# Patient Record
Sex: Female | Born: 1965 | Hispanic: No | Marital: Single | Smoking: Never smoker
Health system: Southern US, Community
[De-identification: ages and names within clinical notes are randomized; demographics above are authoritative.]

---

## 2002-01-10 ENCOUNTER — Encounter: Admission: RE | Admit: 2002-01-10 | Discharge: 2002-01-10 | Payer: Self-pay | Admitting: Sports Medicine

## 2002-01-12 ENCOUNTER — Encounter (INDEPENDENT_AMBULATORY_CARE_PROVIDER_SITE_OTHER): Payer: Self-pay | Admitting: *Deleted

## 2003-02-06 ENCOUNTER — Encounter (HOSPITAL_COMMUNITY): Admission: RE | Admit: 2003-02-06 | Discharge: 2003-02-06 | Payer: Self-pay | Admitting: *Deleted

## 2003-02-10 ENCOUNTER — Inpatient Hospital Stay (HOSPITAL_COMMUNITY): Admission: AD | Admit: 2003-02-10 | Discharge: 2003-02-13 | Payer: Self-pay | Admitting: Obstetrics and Gynecology

## 2003-09-27 ENCOUNTER — Emergency Department (HOSPITAL_COMMUNITY): Admission: EM | Admit: 2003-09-27 | Discharge: 2003-09-28 | Payer: Self-pay | Admitting: Emergency Medicine

## 2003-09-29 ENCOUNTER — Emergency Department (HOSPITAL_COMMUNITY): Admission: AD | Admit: 2003-09-29 | Discharge: 2003-09-29 | Payer: Self-pay | Admitting: Family Medicine

## 2006-07-20 ENCOUNTER — Inpatient Hospital Stay (HOSPITAL_COMMUNITY): Admission: AD | Admit: 2006-07-20 | Discharge: 2006-07-20 | Payer: Self-pay | Admitting: Obstetrics and Gynecology

## 2007-02-09 ENCOUNTER — Encounter (INDEPENDENT_AMBULATORY_CARE_PROVIDER_SITE_OTHER): Payer: Self-pay | Admitting: *Deleted

## 2007-03-01 ENCOUNTER — Inpatient Hospital Stay (HOSPITAL_COMMUNITY): Admission: AD | Admit: 2007-03-01 | Discharge: 2007-03-01 | Payer: Self-pay | Admitting: Obstetrics & Gynecology

## 2007-03-08 ENCOUNTER — Inpatient Hospital Stay (HOSPITAL_COMMUNITY): Admission: RE | Admit: 2007-03-08 | Discharge: 2007-03-09 | Payer: Self-pay | Admitting: Obstetrics and Gynecology

## 2017-07-06 ENCOUNTER — Ambulatory Visit (INDEPENDENT_AMBULATORY_CARE_PROVIDER_SITE_OTHER): Payer: BLUE CROSS/BLUE SHIELD | Admitting: Physician Assistant

## 2017-07-06 ENCOUNTER — Encounter: Payer: Self-pay | Admitting: Physician Assistant

## 2017-07-06 VITALS — BP 123/70 | HR 61 | Temp 98.1°F | Resp 18

## 2017-07-06 DIAGNOSIS — R8299 Other abnormal findings in urine: Secondary | ICD-10-CM | POA: Diagnosis not present

## 2017-07-06 DIAGNOSIS — R82998 Other abnormal findings in urine: Secondary | ICD-10-CM

## 2017-07-06 DIAGNOSIS — G44209 Tension-type headache, unspecified, not intractable: Secondary | ICD-10-CM

## 2017-07-06 DIAGNOSIS — M542 Cervicalgia: Secondary | ICD-10-CM

## 2017-07-06 LAB — POCT URINALYSIS DIP (MANUAL ENTRY)
BILIRUBIN UA: NEGATIVE mg/dL
Bilirubin, UA: NEGATIVE
Glucose, UA: NEGATIVE mg/dL
Nitrite, UA: NEGATIVE
PH UA: 5.5 (ref 5.0–8.0)
PROTEIN UA: NEGATIVE mg/dL
RBC UA: NEGATIVE
Spec Grav, UA: >=1.03 — AB (ref 1.010–1.025)
Urobilinogen, UA: 0.2 E.U./dL

## 2017-07-06 LAB — POCT CBC
GRANULOCYTE PERCENT: 69.6 % (ref 37–80)
HCT, POC: 36.3 % — AB (ref 37.7–47.9)
Hemoglobin: 11.6 g/dL — AB (ref 12.2–16.2)
LYMPH, POC: 2.1 (ref 0.6–3.4)
MCH, POC: 24.5 pg — AB (ref 27–31.2)
MCHC: 32 g/dL (ref 31.8–35.4)
MCV: 76.5 fL — AB (ref 80–97)
MID (CBC): 0.4 (ref 0–0.9)
MPV: 8.6 fL (ref 0–99.8)
PLATELET COUNT, POC: 259 10*3/uL (ref 142–424)
POC Granulocyte: 5.7 (ref 2–6.9)
POC LYMPH %: 25.5 % (ref 10–50)
POC MID %: 4.9 %M (ref 0–12)
RBC: 4.74 M/uL (ref 4.04–5.48)
RDW, POC: 14.8 %
WBC: 8.2 10*3/uL (ref 4.6–10.2)

## 2017-07-06 LAB — POCT URINE PREGNANCY: PREG TEST UR: NEGATIVE

## 2017-07-06 MED ORDER — CYCLOBENZAPRINE HCL 10 MG PO TABS: 5.0000 mg | ORAL_TABLET | Freq: Three times a day (TID) | ORAL | 0 refills | Status: DC | PRN

## 2017-07-06 MED ORDER — KETOROLAC TROMETHAMINE 60 MG/2ML IM SOLN
60.0000 mg | Freq: Once | INTRAMUSCULAR | Status: AC
  Administered 2017-07-06: 60 mg via INTRAMUSCULAR

## 2017-07-06 MED ORDER — IBUPROFEN 600 MG PO TABS: 600.0000 mg | ORAL_TABLET | Freq: Three times a day (TID) | ORAL | 0 refills | Status: DC | PRN

## 2017-07-06 NOTE — Progress Notes (Signed)
Patient expressed pain after shot while applying bandage/ S.Xitlali Kastens,CMA

## 2017-07-06 NOTE — Progress Notes (Signed)
07/07/2017 3:42 PM   DOB: 04/07/1966 / MRN: [id number]  SUBJECTIVE:  Linda Blackwell is a 51 y.o. female presenting for a headache that has been present for two weeks but has suddenly become worse. Has tried ibuprofen without relief.  No recent travel. No falls. Tells me the HA is constant and generalized in nature.  No acute changes in vision.  No history of HTN or diabetes. Feels that she is getting worse. No nausea.  Normal appetite.   She has No Known Allergies.   She  has no past medical history on file.    She  reports that she has never smoked. She has never used smokeless tobacco. She  has no sexual activity history on file. The patient  has no past surgical history on file.  Her family history is not on file.  Review of Systems  Constitutional: Negative for chills, diaphoresis and fever.  Eyes: Negative.   Respiratory: Negative for cough, hemoptysis, sputum production, shortness of breath and wheezing.   Cardiovascular: Negative for chest pain, orthopnea and leg swelling.  Gastrointestinal: Negative for abdominal pain and nausea.  Genitourinary: Negative for dysuria, flank pain, frequency, hematuria and urgency.  Skin: Negative for rash.  Neurological: Negative for dizziness, sensory change, speech change, focal weakness and headaches.    The problem list and medications were reviewed and updated by myself where necessary and exist elsewhere in the encounter.   OBJECTIVE:  BP 123/70 (BP Location: Right Arm, Patient Position: Supine, Cuff Size: Large)   Pulse 61   Temp 98.1 F (36.7 C) (Oral)   Resp 18   SpO2 98%   Physical Exam  Constitutional: She is oriented to person, place, and time. She is active.  Non-toxic appearance.  Eyes: Pupils are equal, round, and reactive to light. EOM are normal.  Cardiovascular: Normal rate, regular rhythm, S1 normal, S2 normal, normal heart sounds and intact distal pulses.  Exam reveals no gallop, no friction rub and no decreased pulses.    No murmur heard. Pulmonary/Chest: Effort normal. No stridor. No tachypnea. No respiratory distress. She has no wheezes. She has no rales.  Abdominal: She exhibits no distension.  Musculoskeletal: She exhibits no edema.  Neurological: She is alert and oriented to person, place, and time. She has normal strength and normal reflexes. She is not disoriented. She displays no atrophy, no tremor and normal reflexes. No cranial nerve deficit or sensory deficit. She exhibits normal muscle tone. She displays a negative Romberg sign. She displays no seizure activity. Coordination and gait normal. GCS eye subscore is 4. GCS verbal subscore is 5. GCS motor subscore is 6.  Skin: Skin is warm and dry. She is not diaphoretic. No pallor.  Psychiatric: Her behavior is normal.    Results for orders placed or performed in visit on 07/06/17 (from the past 72 hour(s))  POCT CBC     Status: Abnormal   Collection Time: 07/06/17  6:09 PM  Result Value Ref Range   WBC 8.2 4.6 - 10.2 K/uL   Lymph, poc 2.1 0.6 - 3.4   POC LYMPH PERCENT 25.5 10 - 50 %L   MID (cbc) 0.4 0 - 0.9   POC MID % 4.9 0 - 12 %M   POC Granulocyte 5.7 2 - 6.9   Granulocyte percent 69.6 37 - 80 %G   RBC 4.74 4.04 - 5.48 M/uL   Hemoglobin 11.6 (A) 12.2 - 16.2 g/dL   HCT, POC 86.536.3 (A) 78.437.7 - 47.9 %  MCV 76.5 (A) 80 - 97 fL   MCH, POC 24.5 (A) 27 - 31.2 pg   MCHC 32.0 31.8 - 35.4 g/dL   RDW, POC 16.114.8 %   Platelet Count, POC 259 142 - 424 K/uL   MPV 8.6 0 - 99.8 fL  POCT urinalysis dipstick     Status: Abnormal   Collection Time: 07/06/17  6:13 PM  Result Value Ref Range   Color, UA yellow yellow   Clarity, UA clear clear   Glucose, UA negative negative mg/dL   Bilirubin, UA negative negative   Ketones, POC UA negative negative mg/dL   Spec Grav, UA >=0.960>=1.030 (A) 1.010 - 1.025   Blood, UA negative negative   pH, UA 5.5 5.0 - 8.0   Protein Ur, POC negative negative mg/dL   Urobilinogen, UA 0.2 0.2 or 1.0 E.U./dL   Nitrite, UA Negative  Negative   Leukocytes, UA Trace (A) Negative  POCT urine pregnancy     Status: None   Collection Time: 07/06/17  6:13 PM  Result Value Ref Range   Preg Test, Ur Negative Negative   Orthostatic VS for the past 24 hrs:  BP- Sitting Pulse- Sitting BP- Standing at 0 minutes Pulse- Standing at 0 minutes  07/06/17 1730 117/81 70 124/82 65    No results found.  ASSESSMENT AND PLAN:  Linda Blackwell was seen today for headache.  Diagnoses and all orders for this visit:  Acute non intractable tension-type headache: Patient with complete resolution of symptoms with 60 of Toradol.  No concerning neurological findings.  -     ketorolac (TORADOL) injection 60 mg; Inject 2 mLs (60 mg total) into the muscle once. -     POCT urinalysis dipstick -     POCT CBC -     POCT urine pregnancy -     ibuprofen (ADVIL,MOTRIN) 600 MG tablet; Take 1 tablet (600 mg total) by mouth every 8 (eight) hours as needed.  Neck pain: Likely driving etiology.   Leukocytes in urine -     Urine Culture -     Urinalysis, microscopic only -     cyclobenzaprine (FLEXERIL) 10 MG tablet; Take 0.5-1 tablets (5-10 mg total) by mouth 3 (three) times daily as needed.    The patient is advised to call or return to clinic if she does not see an improvement in symptoms, or to seek the care of the closest emergency department if she worsens with the above plan.   Deliah BostonMichael Tameia Rafferty, MHS, PA-C [facility] 07/07/2017 3:42 PM

## 2017-07-06 NOTE — Patient Instructions (Signed)
     IF you received an x-ray today, you will receive an invoice from [facility]. Please contact  Radiology at [phone] with questions or concerns regarding your invoice.   IF you received labwork today, you will receive an invoice from [facility]. Please contact [facility] at [phone] with questions or concerns regarding your invoice.   Our billing staff will not be able to assist you with questions regarding bills from these companies.  You will be contacted with the lab results as soon as they are available. The fastest way to get your results is to activate your My Chart account. Instructions are located on the last page of this paperwork. If you have not heard from us regarding the results in 2 weeks, please contact this office.     

## 2017-07-08 LAB — URINE CULTURE

## 2017-07-08 LAB — URINALYSIS, MICROSCOPIC ONLY: CASTS: NONE SEEN /LPF

## 2017-07-16 ENCOUNTER — Emergency Department (HOSPITAL_COMMUNITY)
Admission: EM | Admit: 2017-07-16 | Discharge: 2017-07-16 | Disposition: A | Discharge status: Home / Self Care | Payer: BLUE CROSS/BLUE SHIELD | Attending: Emergency Medicine | Admitting: Emergency Medicine

## 2017-07-16 ENCOUNTER — Emergency Department (HOSPITAL_COMMUNITY): Payer: BLUE CROSS/BLUE SHIELD

## 2017-07-16 DIAGNOSIS — Y92009 Unspecified place in unspecified non-institutional (private) residence as the place of occurrence of the external cause: Secondary | ICD-10-CM | POA: Insufficient documentation

## 2017-07-16 DIAGNOSIS — Y999 Unspecified external cause status: Secondary | ICD-10-CM | POA: Diagnosis not present

## 2017-07-16 DIAGNOSIS — S0003XA Contusion of scalp, initial encounter: Secondary | ICD-10-CM | POA: Diagnosis not present

## 2017-07-16 DIAGNOSIS — Y93E5 Activity, floor mopping and cleaning: Secondary | ICD-10-CM | POA: Insufficient documentation

## 2017-07-16 DIAGNOSIS — S0990XA Unspecified injury of head, initial encounter: Secondary | ICD-10-CM

## 2017-07-16 DIAGNOSIS — M542 Cervicalgia: Secondary | ICD-10-CM | POA: Diagnosis not present

## 2017-07-16 DIAGNOSIS — W07XXXA Fall from chair, initial encounter: Secondary | ICD-10-CM | POA: Insufficient documentation

## 2017-07-16 DIAGNOSIS — W19XXXA Unspecified fall, initial encounter: Secondary | ICD-10-CM

## 2017-07-16 LAB — CBG MONITORING, ED: GLUCOSE-CAPILLARY: 97 mg/dL (ref 65–99)

## 2017-07-16 MED ORDER — METHOCARBAMOL 500 MG PO TABS
1000.0000 mg | ORAL_TABLET | Freq: Once | ORAL | Status: AC
  Administered 2017-07-16: 1000 mg via ORAL
  Filled 2017-07-16: qty 2

## 2017-07-16 MED ORDER — IBUPROFEN 800 MG PO TABS: 800.0000 mg | ORAL_TABLET | Freq: Three times a day (TID) | ORAL | 0 refills | Status: DC

## 2017-07-16 MED ORDER — KETOROLAC TROMETHAMINE 60 MG/2ML IM SOLN: 60.0000 mg | Freq: Once | INTRAMUSCULAR | Status: DC

## 2017-07-16 MED ORDER — METHOCARBAMOL 500 MG PO TABS: 1000.0000 mg | ORAL_TABLET | Freq: Three times a day (TID) | ORAL | 0 refills | Status: DC | PRN

## 2017-07-16 NOTE — Discharge Instructions (Signed)
1. Take ibuprofen and Robaxin for pain. 2. You may apply ice pack to the back of your neck and head for the next 48 hours as needed for pain. 3. See her family doctor for recheck within the next 3-5 days. 4. Return to the emergency department if symptoms are worsening or changing.

## 2017-07-16 NOTE — ED Triage Notes (Signed)
Pt states she was cleaning her house because its her birthday and she is having a party tonight. Pt states she slipped and fell hitting her head on the counter. Her son was outside when she fell, but states when he heard the noise he came inside and saw his mother on the floor with her eyes closed. Pt arrived alert and oriented x4 c/o dizziness, neck pain, and head pain. C-collar placed.

## 2017-07-16 NOTE — ED Provider Notes (Signed)
[facility]-EMERGENCY DEPT Provider Note   CSN: [id number] Arrival date & time: 07/16/17  1757     History   Chief Complaint Chief Complaint  Patient presents with  . Head Injury    HPI Linda Blackwell is a 51 y.o. female.  HPI Patient reports that she was cleaning her house. She states today is her birthday. She was standing on a folding chair and fell backwards. She reports she hit the left side of the back of her head and her neck on the counter. She reports she has a lot of pain. Patient's son heard her fall and came in to check on her. He reports her eyes were closed and he thinks she may have passed out. Patient is alert and appropriate now. She does not have weakness numbness or tingling to extremities. She however reports pain on the right posterior neck. She reports is much worse with any type of movement. She denies being on any blood thinners. She denies any medical history. No past medical history on file.  There are no active problems to display for this patient.   No past surgical history on file.  OB History    No data available       Home Medications    Prior to Admission medications   Medication Sig Start Date End Date Taking? Authorizing Provider  cyclobenzaprine (FLEXERIL) 10 MG tablet Take 0.5-1 tablets (5-10 mg total) by mouth 3 (three) times daily as needed. 07/06/17  Yes Ofilia Neaslark, Michael L, PA-C  hydroxypropyl methylcellulose / hypromellose (ISOPTO TEARS / GONIOVISC) 2.5 % ophthalmic solution Place 1 drop into both eyes as needed for dry eyes.   Yes [provider]  ibuprofen (ADVIL,MOTRIN) 600 MG tablet Take 1 tablet (600 mg total) by mouth every 8 (eight) hours as needed. 07/06/17  Yes Ofilia Neaslark, Michael L, PA-C  ibuprofen (ADVIL,MOTRIN) 800 MG tablet Take 1 tablet (800 mg total) by mouth 3 (three) times daily. 07/16/17   Arby BarrettePfeiffer, Kena Limon, MD  methocarbamol (ROBAXIN) 500 MG tablet Take 2 tablets (1,000 mg total) by mouth every 8 (eight) hours as needed for muscle  spasms. 07/16/17   Arby BarrettePfeiffer, Annastacia Duba, MD    Family History No family history on file.  Social History Social History  Substance Use Topics  . Smoking status: Never Smoker  . Smokeless tobacco: Never Used  . Alcohol use Not on file     Allergies   Patient has no known allergies.   Review of Systems Review of Systems 10 Systems reviewed and are negative for acute change except as noted in the HPI.   Physical Exam Updated Vital Signs BP 140/83   Temp 98.4 F (36.9 C) (Oral)   Resp 16   Ht 5\' 2"  (1.575 m)   Wt 89.8 kg (198 lb)   SpO2 100%   BMI 36.21 kg/m   Physical Exam  Constitutional: She is oriented to person, place, and time.  GCS 15. Patient is alert and appropriate. No respiratory distress. She is slightly tearful and complaining of pain. Well-nourished well-developed.  HENT:  Right Ear: External ear normal.  Left Ear: External ear normal.  Nose: Nose normal.  Mouth/Throat: Oropharynx is clear and moist.  Patient was a significant tenderness to palpation of the posterior parietal left scalp. No facial injury.  Eyes: Pupils are equal, round, and reactive to light. EOM are normal.  Neck:  Patient endorses significant pain to palpation to the midline and left lateral C-spine.  Cardiovascular: Normal rate, regular rhythm, normal heart  sounds and intact distal pulses.   Pulmonary/Chest: Effort normal and breath sounds normal.  Abdominal: Soft. She exhibits no distension. There is no tenderness. There is no guarding.  Musculoskeletal: Normal range of motion. She exhibits no edema, tenderness or deformity.  Neurological: She is alert and oriented to person, place, and time. No cranial nerve deficit. Coordination normal.  Skin: Skin is warm and dry.  Psychiatric:  Patient is tearful and anxious.     ED Treatments / Results  Labs (all labs ordered are listed, but only abnormal results are displayed) Labs Reviewed  CBG MONITORING, ED    EKG  EKG  Interpretation None       Radiology Ct Head Wo Contrast  Result Date: 07/16/2017 CLINICAL DATA:  Patient slipped and fell hitting head on counter today. Dizziness, neck pain and headache. EXAM: CT HEAD WITHOUT CONTRAST CT CERVICAL SPINE WITHOUT CONTRAST TECHNIQUE: Multidetector CT imaging of the head and cervical spine was performed following the standard protocol without intravenous contrast. Multiplanar CT image reconstructions of the cervical spine were also generated. COMPARISON:  None. FINDINGS: CT HEAD FINDINGS BRAIN: The ventricles and sulci are normal. No intraparenchymal hemorrhage, mass effect nor midline shift. No acute large vascular territory infarcts. No abnormal extra-axial fluid collections. Basal cisterns are midline and not effaced. No acute cerebellar abnormality. VASCULAR: Unremarkable. SKULL/SOFT TISSUES: No skull fracture. No significant soft tissue swelling. ORBITS/SINUSES: The included ocular globes and orbital contents are normal.The mastoid air-cells and included paranasal sinuses are well-aerated. OTHER: None. CT CERVICAL SPINE FINDINGS ALIGNMENT: Vertebral bodies in alignment. Maintained lordosis. SKULL BASE AND VERTEBRAE: Cervical vertebral bodies and posterior elements are intact. Slight disc space narrowing noted at C4-5. Destructive bony lesions. C1-2 articulation maintained. No jumped or perched facets. Spinous process ease appear intact. SOFT TISSUES AND SPINAL CANAL: Normal. DISC LEVELS: No significant osseous canal stenosis or neural foraminal narrowing. UPPER CHEST: Lung apices are clear. OTHER: None. IMPRESSION: 1. No acute intracranial abnormality. 2. No acute posttraumatic cervical spine fracture or subluxation. Electronically Signed   By: Tollie Ethavid  Kwon M.D.   On: 07/16/2017 19:11   Ct Cervical Spine Wo Contrast  Result Date: 07/16/2017 CLINICAL DATA:  Patient slipped and fell hitting head on counter today. Dizziness, neck pain and headache. EXAM: CT HEAD WITHOUT  CONTRAST CT CERVICAL SPINE WITHOUT CONTRAST TECHNIQUE: Multidetector CT imaging of the head and cervical spine was performed following the standard protocol without intravenous contrast. Multiplanar CT image reconstructions of the cervical spine were also generated. COMPARISON:  None. FINDINGS: CT HEAD FINDINGS BRAIN: The ventricles and sulci are normal. No intraparenchymal hemorrhage, mass effect nor midline shift. No acute large vascular territory infarcts. No abnormal extra-axial fluid collections. Basal cisterns are midline and not effaced. No acute cerebellar abnormality. VASCULAR: Unremarkable. SKULL/SOFT TISSUES: No skull fracture. No significant soft tissue swelling. ORBITS/SINUSES: The included ocular globes and orbital contents are normal.The mastoid air-cells and included paranasal sinuses are well-aerated. OTHER: None. CT CERVICAL SPINE FINDINGS ALIGNMENT: Vertebral bodies in alignment. Maintained lordosis. SKULL BASE AND VERTEBRAE: Cervical vertebral bodies and posterior elements are intact. Slight disc space narrowing noted at C4-5. Destructive bony lesions. C1-2 articulation maintained. No jumped or perched facets. Spinous process ease appear intact. SOFT TISSUES AND SPINAL CANAL: Normal. DISC LEVELS: No significant osseous canal stenosis or neural foraminal narrowing. UPPER CHEST: Lung apices are clear. OTHER: None. IMPRESSION: 1. No acute intracranial abnormality. 2. No acute posttraumatic cervical spine fracture or subluxation. Electronically Signed   By: Rene Kocheravid  Kwon M.D.  On: 07/16/2017 19:11    Procedures Procedures (including critical care time)  Medications Ordered in ED Medications  methocarbamol (ROBAXIN) tablet 1,000 mg (not administered)     Initial Impression / Assessment and Plan / ED Course  I have reviewed the triage vital signs and the nursing notes.  Pertinent labs & imaging results that were available during my care of the patient were reviewed by me and considered in  my medical decision making (see chart for details).     Final Clinical Impressions(s) / ED Diagnoses   Final diagnoses:  Contusion of scalp, initial encounter  Minor head injury, initial encounter  Cervical spine pain  Fall, initial encounter   CT does not show intracranial or C-spine injury. Patient does not have neurologic deficit or confusion. At this time I feel she is appropriate for home observation with head injury precautions. Patient is getting relief from ibuprofen she took prior to arrival. I will have patient continue ibuprofen and Robaxin. New Prescriptions New Prescriptions   IBUPROFEN (ADVIL,MOTRIN) 800 MG TABLET    Take 1 tablet (800 mg total) by mouth 3 (three) times daily.   METHOCARBAMOL (ROBAXIN) 500 MG TABLET    Take 2 tablets (1,000 mg total) by mouth every 8 (eight) hours as needed for muscle spasms.     Arby Barrette, MD 07/16/17 [phone]

## 2017-08-07 ENCOUNTER — Ambulatory Visit: Payer: BLUE CROSS/BLUE SHIELD | Admitting: Physician Assistant

## 2017-12-16 IMAGING — CT CT CERVICAL SPINE W/O CM
4 of 8 series · 12 of 33 positions shown, 13 images · non-contrast
Comparison: None.

CLINICAL DATA: Patient slipped and fell hitting head on counter
today. Dizziness, neck pain and headache.

EXAM:
CT HEAD WITHOUT CONTRAST
CT CERVICAL SPINE WITHOUT CONTRAST
TECHNIQUE: Multidetector CT imaging of the head and cervical spine was
performed following the standard protocol without intravenous
contrast. Multiplanar CT image reconstructions of the cervical spine
were also generated.

[Series 6: sagittal · sagittal · 0.35mm/px · 5 of 64 slices shown]
[im 11/64  bone]
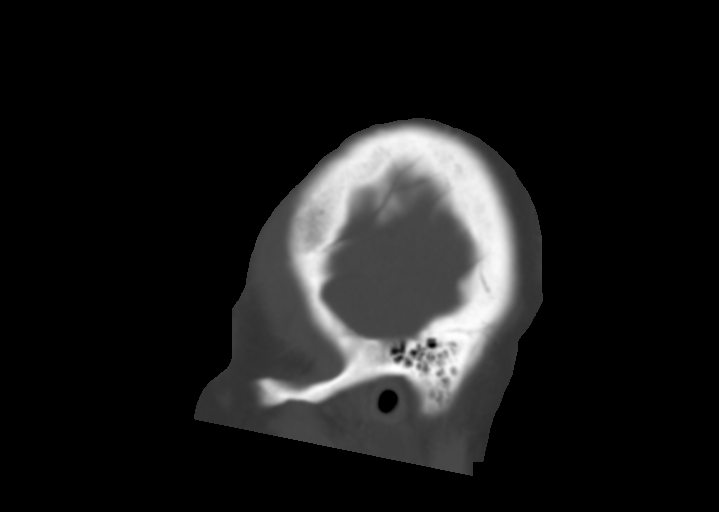
[im 22/64  bone]
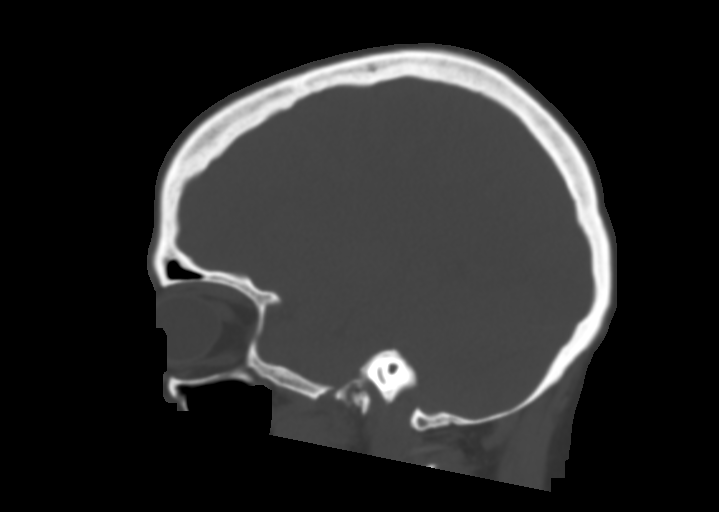
[im 32/64  bone]
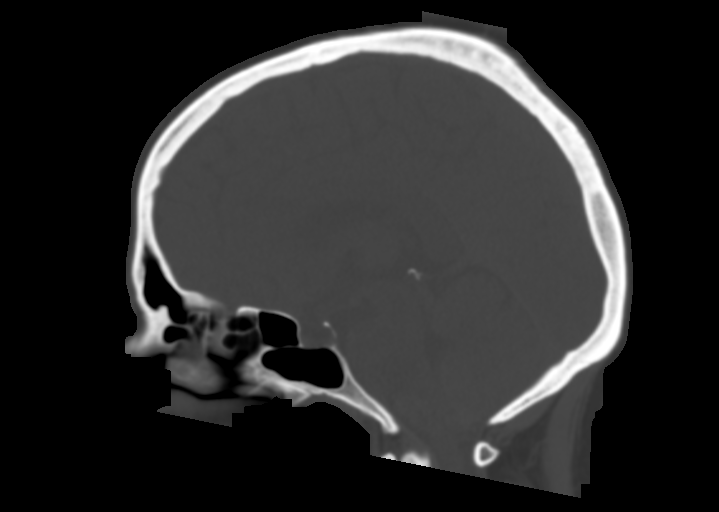
[im 43/64  bone]
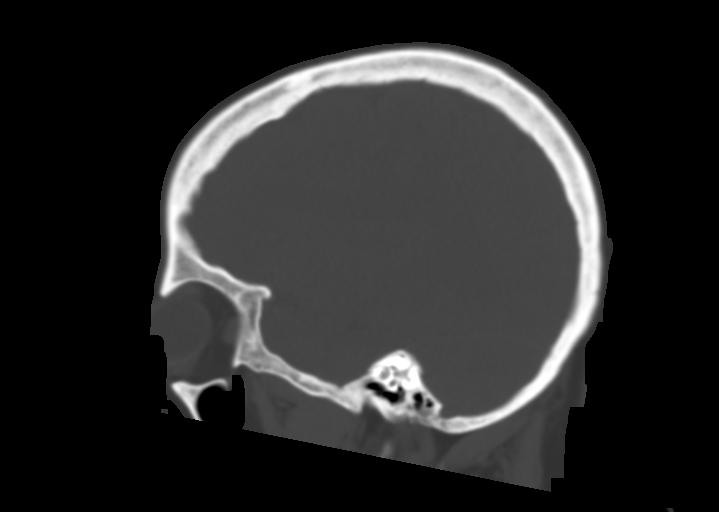
[im 53/64  bone]
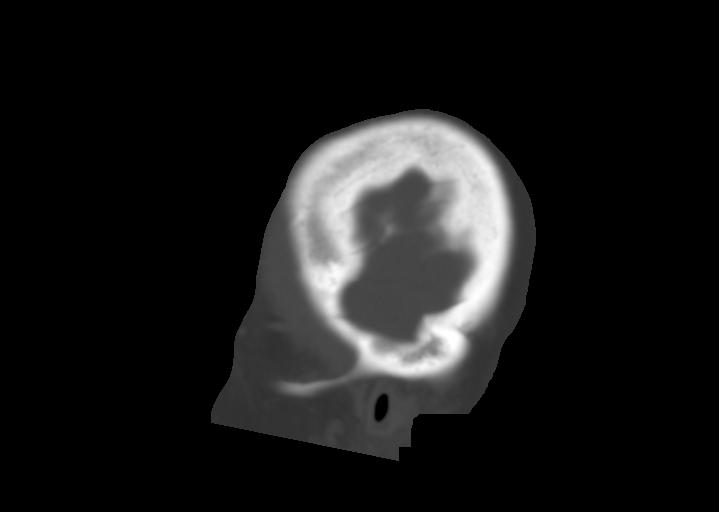

[Series 7: c-spine st · axial · 0.30mm/px · z∈[+1234,+1314]mm · 3 of 81 slices shown, 4 images]
[im 21/81  soft-tissue]
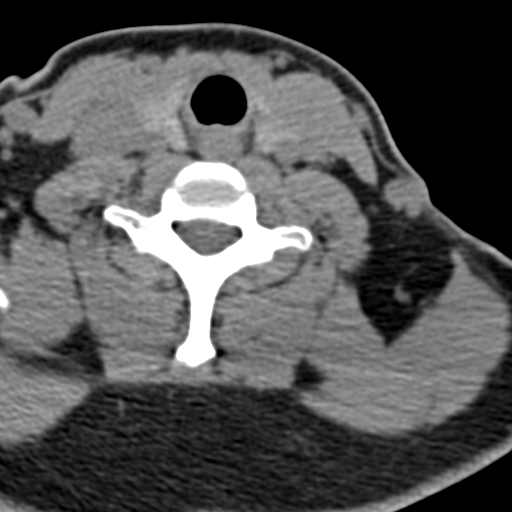
[im 21/81  bone]
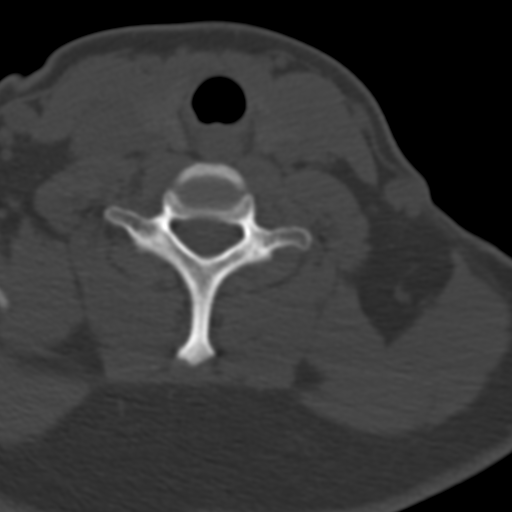
[im 41/81  bone]
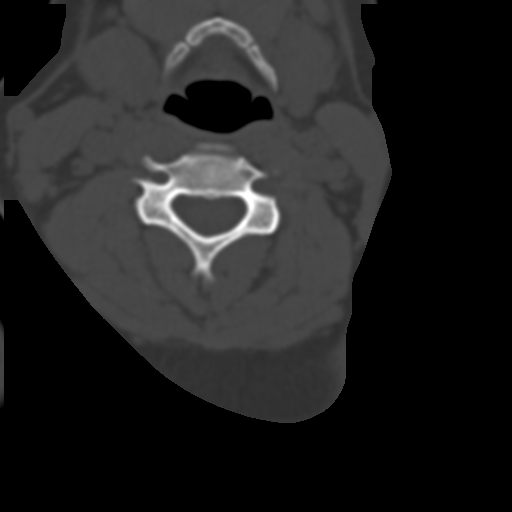
[im 61/81  bone]
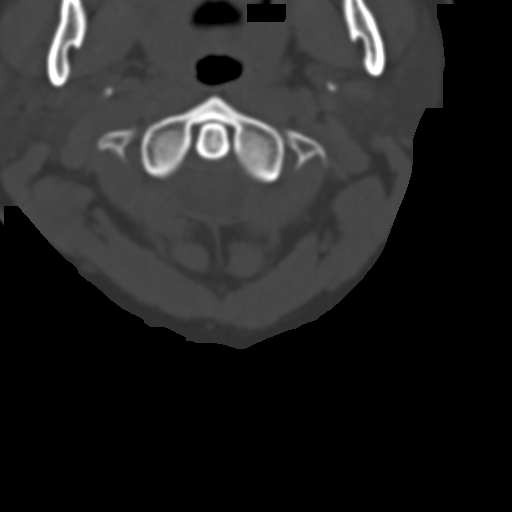

[Series 10: axial recon · axial · 0.23mm/px · z∈[+1220,+1298]mm · 3 of 81 slices shown]
[im 21/81  bone]
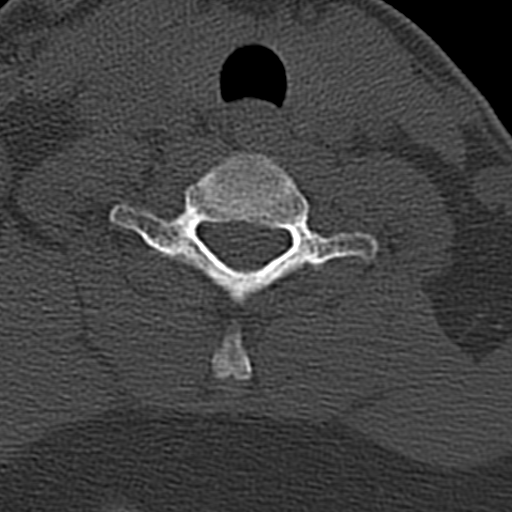
[im 41/81  bone]
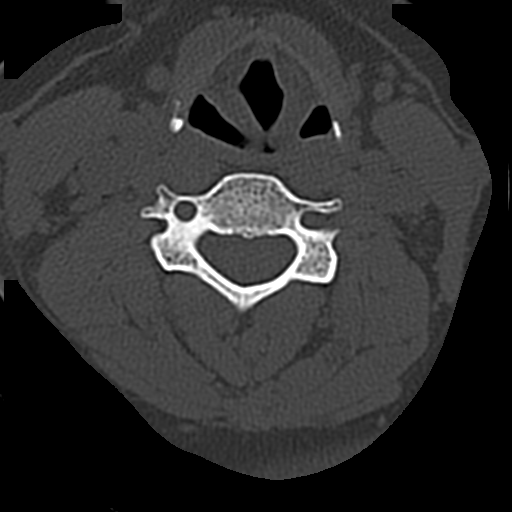
[im 61/81  bone]
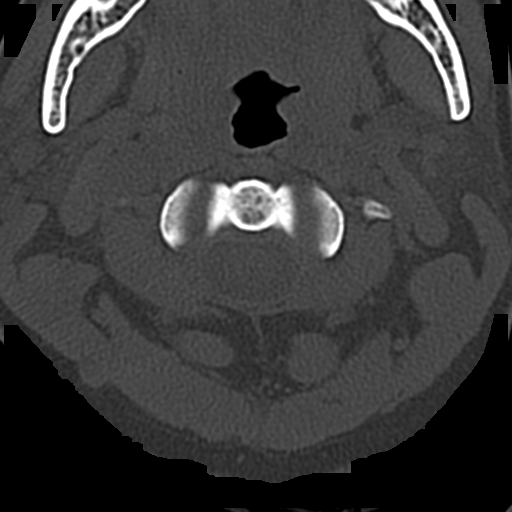

[Series 11: coronal · coronal · 0.23mm/px · 1 of 66 slices shown]
[im 33/66  bone]
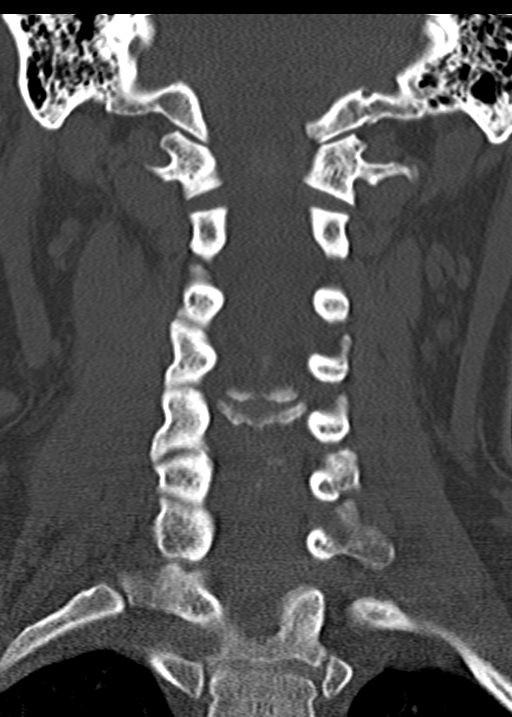

[12 of 33 positions shown; findings below may reference images not displayed]

FINDINGS: CT HEAD FINDINGS

BRAIN: The ventricles and sulci are normal. No intraparenchymal
hemorrhage, mass effect nor midline shift. No acute large vascular
territory infarcts. No abnormal extra-axial fluid collections. Basal
cisterns are midline and not effaced. No acute cerebellar
abnormality.

VASCULAR: Unremarkable.

SKULL/SOFT TISSUES: No skull fracture. No significant soft tissue
swelling.

ORBITS/SINUSES: The included ocular globes and orbital contents are
normal.The mastoid air-cells and included paranasal sinuses are
well-aerated.

OTHER: None.

CT CERVICAL SPINE FINDINGS

ALIGNMENT: Vertebral bodies in alignment. Maintained lordosis.

SKULL BASE AND VERTEBRAE: Cervical vertebral bodies and posterior
elements are intact. Slight disc space narrowing noted at C4-5.
Destructive bony lesions. C1-2 articulation maintained. No jumped or
perched facets. Spinous process ease appear intact.

SOFT TISSUES AND SPINAL CANAL: Normal.

DISC LEVELS: No significant osseous canal stenosis or neural
foraminal narrowing.

UPPER CHEST: Lung apices are clear.

OTHER: None.
IMPRESSION: 1. No acute intracranial abnormality.
2. No acute posttraumatic cervical spine fracture or subluxation.

## 2019-04-02 ENCOUNTER — Ambulatory Visit: Payer: BLUE CROSS/BLUE SHIELD | Admitting: Family Medicine

## 2021-01-07 ENCOUNTER — Other Ambulatory Visit: Payer: Self-pay

## 2021-01-07 DIAGNOSIS — Z20822 Contact with and (suspected) exposure to covid-19: Secondary | ICD-10-CM | POA: Insufficient documentation

## 2021-01-07 DIAGNOSIS — R8290 Unspecified abnormal findings in urine: Secondary | ICD-10-CM | POA: Insufficient documentation

## 2021-01-07 DIAGNOSIS — R5383 Other fatigue: Secondary | ICD-10-CM | POA: Diagnosis not present

## 2021-01-07 LAB — BASIC METABOLIC PANEL
Anion gap: 11 (ref 5–15)
BUN: 17 mg/dL (ref 6–20)
CO2: 26 mmol/L (ref 22–32)
Calcium: 9.1 mg/dL (ref 8.9–10.3)
Chloride: 102 mmol/L (ref 98–111)
Creatinine, Ser: 0.91 mg/dL (ref 0.44–1.00)
GFR, Estimated: >60 mL/min (ref >60)
Glucose, Bld: 103 mg/dL — ABNORMAL HIGH (ref 70–99)
Potassium: 3.8 mmol/L (ref 3.5–5.1)
Sodium: 139 mmol/L (ref 135–145)

## 2021-01-07 LAB — CBC
HCT: 38.2 % (ref 36.0–46.0)
Hemoglobin: 11.9 g/dL — ABNORMAL LOW (ref 12.0–15.0)
MCH: 25.4 pg — ABNORMAL LOW (ref 26.0–34.0)
MCHC: 31.2 g/dL (ref 30.0–36.0)
MCV: 81.6 fL (ref 80.0–100.0)
Platelets: 276 10*3/uL (ref 150–400)
RBC: 4.68 MIL/uL (ref 3.87–5.11)
RDW: 13.6 % (ref 11.5–15.5)
WBC: 8.2 10*3/uL (ref 4.0–10.5)
nRBC: 0 % (ref 0.0–0.2)

## 2021-01-07 LAB — I-STAT BETA HCG BLOOD, ED (MC, WL, AP ONLY): I-stat hCG, quantitative: 5.2 m[IU]/mL — ABNORMAL HIGH (ref <5)

## 2021-01-07 NOTE — ED Triage Notes (Signed)
Pt presents from home, c/o fatigue and dizziness since Monday that has gotten worse. Denies fevers, cough, n/v/d or sick exposure

## 2021-01-08 ENCOUNTER — Encounter (HOSPITAL_COMMUNITY): Payer: Self-pay | Admitting: Emergency Medicine

## 2021-01-08 ENCOUNTER — Emergency Department (HOSPITAL_COMMUNITY)
Admission: EM | Admit: 2021-01-08 | Discharge: 2021-01-08 | Disposition: A | Discharge status: Home / Self Care | Payer: BC Managed Care – PPO | Attending: Emergency Medicine | Admitting: Emergency Medicine

## 2021-01-08 DIAGNOSIS — R8290 Unspecified abnormal findings in urine: Secondary | ICD-10-CM

## 2021-01-08 DIAGNOSIS — R5383 Other fatigue: Secondary | ICD-10-CM

## 2021-01-08 LAB — URINALYSIS, ROUTINE W REFLEX MICROSCOPIC
Bacteria, UA: NONE SEEN
Bilirubin Urine: NEGATIVE
Glucose, UA: NEGATIVE mg/dL
Hgb urine dipstick: NEGATIVE
Ketones, ur: 5 mg/dL — AB
Nitrite: NEGATIVE
Protein, ur: 30 mg/dL — AB
Specific Gravity, Urine: 1.034 — ABNORMAL HIGH (ref 1.005–1.030)
pH: 5 (ref 5.0–8.0)

## 2021-01-08 LAB — TROPONIN I (HIGH SENSITIVITY): Troponin I (High Sensitivity): 4 ng/L (ref <18)

## 2021-01-08 LAB — TSH: TSH: 1.512 u[IU]/mL (ref 0.350–4.500)

## 2021-01-08 LAB — SARS CORONAVIRUS 2 (TAT 6-24 HRS): SARS Coronavirus 2: NEGATIVE

## 2021-01-08 MED ORDER — CEPHALEXIN 500 MG PO CAPS: 500.0000 mg | ORAL_CAPSULE | Freq: Two times a day (BID) | ORAL | 0 refills | Status: DC

## 2021-01-08 NOTE — Discharge Instructions (Addendum)
Your urine looks like it could be infected.  This could cause fatigue.  We will treat you with antibiotic.  We will call you if your Covid test is positive.  I also ordered a thyroid test, but this will not result tonight.  You can have your primary care doctor follow-up on this.

## 2021-01-08 NOTE — ED Provider Notes (Signed)
[facility]-EMERGENCY DEPT Provider Note   CSN: [id number] Arrival date & time: 01/07/21  1956     History Chief Complaint  Patient presents with  . Fatigue  . Dizziness    Linda Blackwell is a 55 y.o. female.  Patient presents to the emergency department with a chief complaint of generalized fatigue.  She states that she has felt very fatigued over the past several days.  She denies any other associated symptoms, specifically no fever, cough, chest pain, shortness of breath.  She is vaccinated against Covid, but she has had potential exposures at work.  She states that she is normally very healthy, and is not certain why she feels so fatigued.  She states that she also feels somewhat dizzy when she goes from sitting to standing.  Denies numbness, weakness, or tingling.  The history is provided by the patient. No language interpreter was used.       No past medical history on file.  There are no problems to display for this patient.   History reviewed. No pertinent surgical history.   OB History   No obstetric history on file.     No family history on file.  Social History   Tobacco Use  . Smoking status: Never Smoker  . Smokeless tobacco: Never Used    Home Medications Prior to Admission medications   Medication Sig Start Date End Date Taking? Authorizing Provider  cyclobenzaprine (FLEXERIL) 10 MG tablet Take 0.5-1 tablets (5-10 mg total) by mouth 3 (three) times daily as needed. 07/06/17   Ofilia Neas, PA-C  hydroxypropyl methylcellulose / hypromellose (ISOPTO TEARS / GONIOVISC) 2.5 % ophthalmic solution Place 1 drop into both eyes as needed for dry eyes.    [provider]  ibuprofen (ADVIL,MOTRIN) 600 MG tablet Take 1 tablet (600 mg total) by mouth every 8 (eight) hours as needed. 07/06/17   Ofilia Neas, PA-C  ibuprofen (ADVIL,MOTRIN) 800 MG tablet Take 1 tablet (800 mg total) by mouth 3 (three) times daily. 07/16/17   Arby Barrette, MD  methocarbamol (ROBAXIN) 500 MG tablet Take 2 tablets (1,000 mg total) by mouth every 8 (eight) hours as needed for muscle spasms. 07/16/17   Arby Barrette, MD    Allergies    Patient has no known allergies.  Review of Systems   Review of Systems  All other systems reviewed and are negative.   Physical Exam Updated Vital Signs BP 139/82 (BP Location: Right Arm)   Pulse 60   Temp 98.2 F (36.8 C) (Oral)   Resp 20   Ht 5\' 5"  (1.651 m)   Wt 96.2 kg   LMP  (LMP Unknown)   SpO2 99%   BMI 35.28 kg/m   Physical Exam Vitals and nursing note reviewed.  Constitutional:      General: She is not in acute distress.    Appearance: She is well-developed and well-nourished.  HENT:     Head: Normocephalic and atraumatic.  Eyes:     Conjunctiva/sclera: Conjunctivae normal.  Cardiovascular:     Rate and Rhythm: Normal rate and regular rhythm.     Heart sounds: No murmur heard.   Pulmonary:     Effort: Pulmonary effort is normal. No respiratory distress.     Breath sounds: Normal breath sounds.  Abdominal:     Palpations: Abdomen is soft.     Tenderness: There is no abdominal tenderness.  Musculoskeletal:        General: No edema. Normal range  of motion.     Cervical back: Neck supple.     Comments: Ambulates without difficulty  Skin:    General: Skin is warm and dry.  Neurological:     Mental Status: She is alert and oriented to person, place, and time.  Psychiatric:        Mood and Affect: Mood and affect and mood normal.        Behavior: Behavior normal.     ED Results / Procedures / Treatments   Labs (all labs ordered are listed, but only abnormal results are displayed) Labs Reviewed  BASIC METABOLIC PANEL - Abnormal; Notable for the following components:      Result Value   Glucose, Bld 103 (*)    All other components within normal limits  CBC - Abnormal; Notable for the following components:   Hemoglobin 11.9 (*)    MCH 25.4 (*)    All other  components within normal limits  I-STAT BETA HCG BLOOD, ED ([facility], [facility], [facility] ONLY) - Abnormal; Notable for the following components:   I-stat hCG, quantitative 5.2 (*)    All other components within normal limits  SARS CORONAVIRUS 2 (TAT 6-24 HRS)  URINALYSIS, ROUTINE W REFLEX MICROSCOPIC  TSH  TROPONIN I (HIGH SENSITIVITY)    EKG None  Radiology No results found.  Procedures Procedures   Medications Ordered in ED Medications - No data to display  ED Course  I have reviewed the triage vital signs and the nursing notes.  Pertinent labs & imaging results that were available during my care of the patient were reviewed by me and considered in my medical decision making (see chart for details).    MDM Rules/Calculators/A&P                          This patient complains of generalized fatigue x a few days, this involves an extensive number of treatment options, and is a complaint that carries with it a high risk of complications and morbidity.    Differential Dx COVID, thyroid, electrolyte abnormality, dehydration, hypoglycemia  Pertinent Labs I ordered, reviewed, and interpreted labs, which included CBC notable for no significant leukocytosis, BMP shows normal electrolytes, hCG is 5.2, urinalysis shows findings consistent with UTI, troponin is 4, doubt ACS.  Covid test pending..  Reassessments After the interventions stated above, I reevaluated the patient and found patient requesting discharge.  She would like to go home now.  Her labs are fairly reassuring.  Covid test still pending.  Fatigue could also be attributed to UTI, will cover with Keflex.  Consultants None  Plan Discharge with PCP follow-up.    Final Clinical Impression(s) / ED Diagnoses Final diagnoses:  Fatigue, unspecified type  Abnormal urinalysis    Rx / DC Orders ED Discharge Orders         Ordered    cephALEXin (KEFLEX) 500 MG capsule  2 times daily        01/08/21 0149           Roxy Horseman, PA-C 01/08/21 0152    Palumbo, April, MD 01/08/21 0201

## 2021-01-08 NOTE — ED Notes (Signed)
Discharged no concerns at this time.  °

## 2022-07-14 ENCOUNTER — Emergency Department (HOSPITAL_COMMUNITY): Payer: BC Managed Care – PPO

## 2022-07-14 ENCOUNTER — Other Ambulatory Visit: Payer: Self-pay

## 2022-07-14 ENCOUNTER — Observation Stay (HOSPITAL_COMMUNITY)
Admission: EM | Admit: 2022-07-14 | Discharge: 2022-07-15 | Disposition: A | Discharge status: Home / Self Care | Payer: BC Managed Care – PPO | Attending: Internal Medicine | Admitting: Internal Medicine

## 2022-07-14 DIAGNOSIS — R2990 Unspecified symptoms and signs involving the nervous system: Secondary | ICD-10-CM

## 2022-07-14 DIAGNOSIS — G459 Transient cerebral ischemic attack, unspecified: Principal | ICD-10-CM | POA: Diagnosis present

## 2022-07-14 DIAGNOSIS — H518 Other specified disorders of binocular movement: Secondary | ICD-10-CM

## 2022-07-14 DIAGNOSIS — Z79899 Other long term (current) drug therapy: Secondary | ICD-10-CM | POA: Insufficient documentation

## 2022-07-14 DIAGNOSIS — R531 Weakness: Secondary | ICD-10-CM | POA: Diagnosis not present

## 2022-07-14 DIAGNOSIS — R414 Neurologic neglect syndrome: Secondary | ICD-10-CM | POA: Diagnosis not present

## 2022-07-14 DIAGNOSIS — R200 Anesthesia of skin: Secondary | ICD-10-CM | POA: Diagnosis not present

## 2022-07-14 DIAGNOSIS — R29818 Other symptoms and signs involving the nervous system: Secondary | ICD-10-CM

## 2022-07-14 LAB — I-STAT CHEM 8, ED
BUN: 11 mg/dL (ref 6–20)
Calcium, Ion: 1.12 mmol/L — ABNORMAL LOW (ref 1.15–1.40)
Chloride: 104 mmol/L (ref 98–111)
Creatinine, Ser: 0.6 mg/dL (ref 0.44–1.00)
Glucose, Bld: 122 mg/dL — ABNORMAL HIGH (ref 70–99)
HCT: 41 % (ref 36.0–46.0)
Hemoglobin: 13.9 g/dL (ref 12.0–15.0)
Potassium: 3.7 mmol/L (ref 3.5–5.1)
Sodium: 139 mmol/L (ref 135–145)
TCO2: 22 mmol/L (ref 22–32)

## 2022-07-14 LAB — DIFFERENTIAL
Abs Immature Granulocytes: 0.02 10*3/uL (ref 0.00–0.07)
Basophils Absolute: 0 10*3/uL (ref 0.0–0.1)
Basophils Relative: 0 %
Eosinophils Absolute: 0.1 10*3/uL (ref 0.0–0.5)
Eosinophils Relative: 1 %
Immature Granulocytes: 0 %
Lymphocytes Relative: 28 %
Lymphs Abs: 2.6 10*3/uL (ref 0.7–4.0)
Monocytes Absolute: 0.5 10*3/uL (ref 0.1–1.0)
Monocytes Relative: 5 %
Neutro Abs: 6 10*3/uL (ref 1.7–7.7)
Neutrophils Relative %: 66 %

## 2022-07-14 LAB — CBC
HCT: 40.6 % (ref 36.0–46.0)
Hemoglobin: 13 g/dL (ref 12.0–15.0)
MCH: 27.5 pg (ref 26.0–34.0)
MCHC: 32 g/dL (ref 30.0–36.0)
MCV: 86 fL (ref 80.0–100.0)
Platelets: 198 10*3/uL (ref 150–400)
RBC: 4.72 MIL/uL (ref 3.87–5.11)
RDW: 12.8 % (ref 11.5–15.5)
WBC: 9.2 10*3/uL (ref 4.0–10.5)
nRBC: 0 % (ref 0.0–0.2)

## 2022-07-14 LAB — COMPREHENSIVE METABOLIC PANEL
ALT: 21 U/L (ref 0–44)
AST: 20 U/L (ref 15–41)
Albumin: 3.5 g/dL (ref 3.5–5.0)
Alkaline Phosphatase: 76 U/L (ref 38–126)
Anion gap: 7 (ref 5–15)
BUN: 9 mg/dL (ref 6–20)
CO2: 25 mmol/L (ref 22–32)
Calcium: 8.7 mg/dL — ABNORMAL LOW (ref 8.9–10.3)
Chloride: 102 mmol/L (ref 98–111)
Creatinine, Ser: 0.67 mg/dL (ref 0.44–1.00)
GFR, Estimated: >60 mL/min (ref >60)
Glucose, Bld: 103 mg/dL — ABNORMAL HIGH (ref 70–99)
Potassium: 4 mmol/L (ref 3.5–5.1)
Sodium: 134 mmol/L — ABNORMAL LOW (ref 135–145)
Total Bilirubin: 0.4 mg/dL (ref 0.3–1.2)
Total Protein: 6.5 g/dL (ref 6.5–8.1)

## 2022-07-14 LAB — PROTIME-INR
INR: 1 (ref 0.8–1.2)
Prothrombin Time: 13.4 seconds (ref 11.4–15.2)

## 2022-07-14 LAB — ETHANOL: Alcohol, Ethyl (B): <10 mg/dL (ref <10)

## 2022-07-14 LAB — HIV ANTIBODY (ROUTINE TESTING W REFLEX): HIV Screen 4th Generation wRfx: NONREACTIVE

## 2022-07-14 LAB — CBG MONITORING, ED: Glucose-Capillary: 151 mg/dL — ABNORMAL HIGH (ref 70–99)

## 2022-07-14 LAB — APTT: aPTT: 29 seconds (ref 24–36)

## 2022-07-14 MED ORDER — SODIUM CHLORIDE 0.9% FLUSH
3.0000 mL | Freq: Once | INTRAVENOUS | Status: AC
  Administered 2022-07-14: 3 mL via INTRAVENOUS

## 2022-07-14 MED ORDER — LORAZEPAM 2 MG/ML IJ SOLN
1.0000 mg | Freq: Once | INTRAMUSCULAR | Status: AC
  Administered 2022-07-14: 1 mg via INTRAVENOUS

## 2022-07-14 MED ORDER — LORAZEPAM 2 MG/ML IJ SOLN: 1.0000 mg | Freq: Once | INTRAMUSCULAR | Status: DC | PRN

## 2022-07-14 MED ORDER — ACETAMINOPHEN 160 MG/5ML PO SOLN: 650.0000 mg | ORAL | Status: DC | PRN

## 2022-07-14 MED ORDER — ACETAMINOPHEN 325 MG PO TABS: 650.0000 mg | ORAL_TABLET | ORAL | Status: DC | PRN

## 2022-07-14 MED ORDER — STROKE: EARLY STAGES OF RECOVERY BOOK: Freq: Once | Status: AC

## 2022-07-14 MED ORDER — ACETAMINOPHEN 650 MG RE SUPP: 650.0000 mg | RECTAL | Status: DC | PRN

## 2022-07-14 MED ORDER — IOHEXOL 350 MG/ML SOLN
175.0000 mL | Freq: Once | INTRAVENOUS | Status: AC | PRN
  Administered 2022-07-14: 175 mL via INTRAVENOUS

## 2022-07-14 MED ORDER — ASPIRIN 81 MG PO CHEW
81.0000 mg | CHEWABLE_TABLET | Freq: Every day | ORAL | Status: DC
  Administered 2022-07-14: 81 mg via ORAL
  Filled 2022-07-14: qty 1

## 2022-07-14 MED ORDER — LORAZEPAM 2 MG/ML IJ SOLN
INTRAMUSCULAR | Status: AC
  Filled 2022-07-14: qty 1

## 2022-07-14 MED ORDER — SODIUM CHLORIDE 0.9 % IV BOLUS
500.0000 mL | Freq: Once | INTRAVENOUS | Status: AC
  Administered 2022-07-14: 500 mL via INTRAVENOUS

## 2022-07-14 MED ORDER — CLOPIDOGREL BISULFATE 75 MG PO TABS
75.0000 mg | ORAL_TABLET | Freq: Every day | ORAL | Status: DC
  Administered 2022-07-14: 75 mg via ORAL
  Filled 2022-07-14: qty 1

## 2022-07-14 NOTE — H&P (Signed)
History and Physical    [name] CHE:[id number] DOB: [birth year] DOA: 07/14/2022  PCP: Patient, No Pcp Per  Patient coming from: home  I have personally briefly reviewed patient's old medical records in [facility]  Chief Complaint: right sided weakness  HPI: Linda Blackwell is a 56 y.o. female with medical history significant of  HTN no on medication, who had syncopal event while at pcp office. After event patient was noted to have significant right sided weakness.  EMS was called and CODE CVA activated.  Per patient She notes right arm was weak and numb and unable to use it. She also felt unsteady with walking .She states  felt ill from the day before and note pain in right arm. She notes no n/v/f/c/sob/or chest pain.  ED Course:  Vitals: On arrival to ED patient NIHSS was 9 Labs: Wbc9.2, hgb 13,plt 198 NA 139, K 3.7, CL 104, cr 0.6  Glu 122 cal 1.12  EtOH<10, Glu 151 CTH:NAD CTA head and neck: no LVO TMB:PJPET rhythm no st -twave changes TX:asa 81,plavix 75mg   Review of Systems: As per HPI otherwise 10 point review of systems negative.   No past medical history on file.  No past surgical history on file.   reports that she has never smoked. She has never used smokeless tobacco. No history on file for alcohol use and drug use.  No Known Allergies  No family history on file.  Prior to Admission medications   Medication Sig Start Date End Date Taking? Authorizing Provider  hydroxypropyl methylcellulose / hypromellose (ISOPTO TEARS / GONIOVISC) 2.5 % ophthalmic solution Place 1 drop into both eyes as needed for dry eyes.   Yes [provider]  ibuprofen (ADVIL) 200 MG tablet Take 400 mg by mouth every 6 (six) hours as needed for headache or mild pain.   Yes [provider]    Physical Exam: Vitals:   07/14/22 1715 07/14/22 1745 07/14/22 1800 07/14/22 1815  BP: 111/62 128/74 121/76 118/75  Pulse: 64 (!) 56 63 65  Resp: (!) 22 16 15 19   Temp:       TempSrc:      SpO2: 98% 100% 100% 100%  Weight:         Vitals:   07/14/22 1715 07/14/22 1745 07/14/22 1800 07/14/22 1815  BP: 111/62 128/74 121/76 118/75  Pulse: 64 (!) 56 63 65  Resp: (!) 22 16 15 19   Temp:      TempSrc:      SpO2: 98% 100% 100% 100%  Weight:      Constitutional: NAD, calm, comfortable Eyes: PERRL, lids and conjunctivae normal ENMT: Mucous membranes are moist. Posterior pharynx clear of any exudate or lesions.Normal dentition.  Neck: normal, supple, no masses, no thyromegaly Respiratory: clear to auscultation bilaterally, no wheezing, no crackles. Normal respiratory effort. No accessory muscle use.  Cardiovascular: Regular rate and rhythm, no murmurs / rubs / gallops. No extremity edema. 2+ pedal pulses. Abdomen: no tenderness, no masses palpated. No hepatosplenomegaly. Bowel sounds positive.  Musculoskeletal: no clubbing / cyanosis. No joint deformity upper and lower extremities. Good ROM, no contractures. Normal muscle tone.  Skin: no rashes, lesions, ulcers. No induration Neurologic: CN 2-12 grossly intact. Sensation intact, Strength 5/5 in all 4.  Psychiatric: Normal judgment and insight. Alert and oriented x 3. Normal mood.    Labs on Admission: I have personally reviewed following labs and imaging studies  CBC: Recent Labs  Lab 07/14/22 1610 07/14/22 1620  WBC 9.2  --  NEUTROABS 6.0  --   HGB 13.0 13.9  HCT 40.6 41.0  MCV 86.0  --   PLT 198  --    Basic Metabolic Panel: Recent Labs  Lab 07/14/22 1620 07/14/22 1735  NA 139 134*  K 3.7 4.0  CL 104 102  CO2  --  25  GLUCOSE 122* 103*  BUN 11 9  CREATININE 0.60 0.67  CALCIUM  --  8.7*   GFR: CrCl cannot be calculated (Unknown ideal weight.). Liver Function Tests: Recent Labs  Lab 07/14/22 1735  AST 20  ALT 21  ALKPHOS 76  BILITOT 0.4  PROT 6.5  ALBUMIN 3.5   No results for input(s): "LIPASE", "AMYLASE" in the last 168 hours. No results for input(s): "AMMONIA" in the last 168  hours. Coagulation Profile: Recent Labs  Lab 07/14/22 1610  INR 1.0   Cardiac Enzymes: No results for input(s): "CKTOTAL", "CKMB", "CKMBINDEX", "TROPONINI" in the last 168 hours. BNP (last 3 results) No results for input(s): "PROBNP" in the last 8760 hours. HbA1C: No results for input(s): "HGBA1C" in the last 72 hours. CBG: Recent Labs  Lab 07/14/22 1611  GLUCAP 151*   Lipid Profile: No results for input(s): "CHOL", "HDL", "LDLCALC", "TRIG", "CHOLHDL", "LDLDIRECT" in the last 72 hours. Thyroid Function Tests: No results for input(s): "TSH", "T4TOTAL", "FREET4", "T3FREE", "THYROIDAB" in the last 72 hours. Anemia Panel: No results for input(s): "VITAMINB12", "FOLATE", "FERRITIN", "TIBC", "IRON", "RETICCTPCT" in the last 72 hours. Urine analysis:    Component Value Date/Time   COLORURINE YELLOW 01/07/2021 0035   APPEARANCEUR HAZY (A) 01/07/2021 0035   LABSPEC 1.034 (H) 01/07/2021 0035   PHURINE 5.0 01/07/2021 0035   GLUCOSEU NEGATIVE 01/07/2021 0035   HGBUR NEGATIVE 01/07/2021 0035   BILIRUBINUR NEGATIVE 01/07/2021 0035   BILIRUBINUR negative 07/06/2017 1813   KETONESUR 5 (A) 01/07/2021 0035   PROTEINUR 30 (A) 01/07/2021 0035   UROBILINOGEN 0.2 07/06/2017 1813   NITRITE NEGATIVE 01/07/2021 0035   LEUKOCYTESUR MODERATE (A) 01/07/2021 0035    Radiological Exams on Admission: CT ANGIO HEAD NECK W WO CM W PERF (CODE STROKE)  Result Date: 07/14/2022 CLINICAL DATA:  Neuro deficit, acute, stroke suspected R sided weakness and neglect, L gaze preference EXAM: CT ANGIOGRAPHY HEAD AND NECK CT PERFUSION BRAIN TECHNIQUE: Multidetector CT imaging of the head and neck was performed using the standard protocol during bolus administration of intravenous contrast. Multiplanar CT image reconstructions and MIPs were obtained to evaluate the vascular anatomy. Carotid stenosis measurements (when applicable) are obtained utilizing NASCET criteria, using the distal internal carotid diameter as the  denominator. Multiphase CT imaging of the brain was performed following IV bolus contrast injection. Subsequent parametric perfusion maps were calculated using RAPID software. RADIATION DOSE REDUCTION: This exam was performed according to the departmental dose-optimization program which includes automated exposure control, adjustment of the mA and/or kV according to patient size and/or use of iterative reconstruction technique. CONTRAST:  OMNIPAQUE IOHEXOL 350 MG/ML SOLN COMPARISON:  Same day CT head. FINDINGS: CTA NECK FINDINGS Aortic arch: Great vessel origins are patent without significant stenosis. Right carotid system: No evidence of dissection, stenosis (50% or greater), or occlusion. Left carotid system: No evidence of dissection, stenosis (50% or greater), or occlusion. Vertebral arteries: Codominant. No evidence of dissection, stenosis (50% or greater), or occlusion. Skeleton: No acute findings. Other neck: No acute abnormality. Upper chest: Visualized lung apices are clear. Review of the MIP images confirms the above findings CTA HEAD FINDINGS Anterior circulation: Bilateral intracranial ICAs, MCAs,  MCAs, and ACAs are patent without proximal hemodynamically significant stenosis. The A2 ACA may be partially azygos, anatomic variant. Posterior circulation: Bilateral intradural vertebral arteries, basilar artery and bilateral posterior cerebral arteries are patent without proximal hemodynamically significant stenosis. Venous sinuses: As permitted by contrast timing, patent. CT Brain Perfusion Findings: ASPECTS: 10. CBF (<30%) Volume: 67mL Perfusion (Tmax>6.0s) volume: 48mL Mismatch Volume: 38mL Infarction Location:None identified. IMPRESSION: 1. No emergent large vessel occlusion or proximal hemodynamically significant stenosis. 2. No evidence of core infarct or penumbra by CT perfusion. MRI could provide more sensitive evaluation for acute infarct if clinically indicated. Findings discussed with Dr. Selina Cooley  via telephone at 4:45 p.m. Electronically Signed   By: Feliberto Harts M.D.   On: 07/14/2022 17:01   CT HEAD CODE STROKE WO CONTRAST  Result Date: 07/14/2022 CLINICAL DATA:  Code stroke.  Neuro deficit, acute, stroke suspected EXAM: CT HEAD WITHOUT CONTRAST TECHNIQUE: Contiguous axial images were obtained from the base of the skull through the vertex without intravenous contrast. RADIATION DOSE REDUCTION: This exam was performed according to the departmental dose-optimization program which includes automated exposure control, adjustment of the mA and/or kV according to patient size and/or use of iterative reconstruction technique. COMPARISON:  None Available. FINDINGS: Brain: No evidence of acute infarction, hemorrhage, hydrocephalus, extra-axial collection or mass lesion/mass effect. Vascular: No hyperdense vessel identified. Skull: No acute fracture. Sinuses/Orbits: Clear sinuses. Other: No mastoid effusions. ASPECTS [facility] Stroke Program Early CT Score) total score (0-10 with 10 being normal): 10. IMPRESSION: 1. No evidence of acute intracranial abnormality. 2. ASPECTS is 10. Code stroke imaging results were communicated on 07/14/2022 at 4:25 pm to provider [facility] via secure text paging. Electronically Signed   By: Feliberto Harts M.D.   On: 07/14/2022 16:25    EKG: Independently reviewed.   Assessment/Plan TIA r/o CVA -right sided weakness  --in the field noted elevated BP systolic in 200's on admit to ED 160's -hx of HTN untreated -admit NIHSS  of 9 -CTH, CTA head and neck unremarkable  -symptoms now completely resolved - case discussed with neurology  -admit tia/cva r/o protocol -MRI brain pending  -A1c/HLD pending -Permissive HTN x48 hours from sx onset or until stroke ruled out by MRI -goal BP < 220/110, PRN above these parameters - per neuro ASA 81mg  daily + plavix 75mg  daily x21 days f/b ASA 81mg  daily monotherapy after that -neuro checks , SLP, PT/OT  -await final neuro recs       HTN -not on medication/ loss to follow up with pcp     DVT prophylaxis: scd Code Status: full Family Communication:  Disposition Plan: patient  expected to be admitted less than 2 midnights  Consults called: neuro Dr Admission status:progressive care   MD [facility]   If 7PM-7AM, please contact night-coverage [email or web address] Password [email or web address]  07/14/2022, 7:52 PM

## 2022-07-14 NOTE — ED Notes (Signed)
Received verbal report from Amy K RN at this time 

## 2022-07-14 NOTE — ED Notes (Signed)
Patient transported to MRI 

## 2022-07-14 NOTE — Code Documentation (Signed)
Linda Blackwell is a 56 yr old female with hypertension. She is on no thinners. Pt was at MD office today, had a syncopal episode, after which, rt side was weak. EMS activated code stroke.     Pt arrived [facility] at 1614. Airway cleared, CBG, blood obtained at bridge. Pt had NIHSS 9 (see flowsheet for details), significantly for rt weakness and neglect. Exam limited by pt's anxiety and pain upon moving either arm.    CT obtained and was neg for acute hemorrhage per Dr. Selina Cooley. CTA obtained, which was negative for LVO per Dr. Selina Cooley. Pt returned to room 12 where her workup will continue. She will need q 2 hr VS and NIHSS. Bedside handoff with [place] done, pt's husband at bedside.

## 2022-07-14 NOTE — ED Notes (Signed)
Pt ambulatory to bathroom

## 2022-07-14 NOTE — Consult Note (Signed)
Neurology Consultation  Reason for Consult: Code stroke  Referring Physician: Dr Stevie Kern  CC: right side weakness  History is obtained from:patient and husband  HPI: Linda Blackwell is a 56 y.o. female with HTN not on medications who presenting from a doctors office after a syncopal episode. She does not have any known allergies. 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 She reported that her hands weren't working right. She went to the a doctors office today and had a syncopal episode witnessed by the nursing staff. EMS was called, at 1557 she had an abrupt neurological change that prompted them to activate a code stroke with right side weakness. Initially for EMS her BP was 200's on arrival to [facility] ED SBP 160's  Upon further questioning husband reported she felt some unilateral paresthesias yesterday therefore that was considered patient's LKW.  Per her husband, she has been diagnosed with hypertension but has never taken medication for it. She does not take any medications at home. He also reports that their son was taken to jail and $4000 was taken out of her bank account which has caused a lot of stress for her. Her stress has been very high lately. She does also report that she has headache about 2 times per year.  She was very anxious in CT scan while attempting to get PIV, 1 mg IV ativan was given and calmed the patient. She endorses that she has a lot of stressors in her life and has bad anxiety. She denies, smoking, alcohol or illicit drug use  CT head no acute process, ASPECTS 10. CTA head/neck with no LVO.  CTP negative.  LKW: yesterday  IV thrombolysis given?: no, outside window Premorbid modified Rankin scale (mRS):  0-Completely asymptomatic and back to baseline post-stroke  ROS: Full ROS was performed and is negative except as noted in the HPI.    No past medical history on file.   No family history  on file.   Social History:   reports that she has never smoked. She has never used smokeless tobacco. No history on file for alcohol use and drug use.  Medications  Current Facility-Administered Medications:    [START ON 07/15/2022]  stroke: early stages of recovery book, , Does not apply, Once, Mathews Argyle, NP   aspirin chewable tablet 81 mg, 81 mg, Oral, Daily, Gevena Mart A, NP, 81 mg at 07/14/22 1754   LORazepam (ATIVAN) 2 MG/ML injection, , , ,    LORazepam (ATIVAN) injection 1 mg, 1 mg, Intravenous, Once PRN, Elmer Picker, NP  Current Outpatient Medications:    cephALEXin (KEFLEX) 500 MG capsule, Take 1 capsule (500 mg total) by mouth 2 (two) times daily., Disp: 14 capsule, Rfl: 0   cyclobenzaprine (FLEXERIL) 10 MG tablet, Take 0.5-1 tablets (5-10 mg total) by mouth 3 (three) times daily as needed., Disp: 30 tablet, Rfl: 0   hydroxypropyl methylcellulose / hypromellose (ISOPTO TEARS / GONIOVISC) 2.5 % ophthalmic solution, Place 1 drop into both eyes as needed for dry eyes., Disp: , Rfl:    ibuprofen (ADVIL,MOTRIN) 600 MG tablet, Take 1 tablet (600 mg total) by mouth every 8 (eight) hours as needed., Disp: 21 tablet, Rfl: 0   ibuprofen (ADVIL,MOTRIN) 800 MG tablet, Take 1 tablet (800 mg total) by mouth 3 (three) times daily., Disp: 21 tablet, Rfl: 0   methocarbamol (ROBAXIN) 500 MG tablet, Take 2 tablets (1,000 mg total) by  mouth every 8 (eight) hours as needed for muscle spasms., Disp: 30 tablet, Rfl: 0   Exam: Current vital signs: BP 121/76   Pulse 63   Temp 97.8 F (36.6 C) (Oral)   Resp 15   Wt 100.8 kg   SpO2 100%   BMI 36.98 kg/m  Vital signs in last 24 hours: Temp:  [97.8 F (36.6 C)] 97.8 F (36.6 C) (08/03 1652) Pulse Rate:  [56-67] 63 (08/03 1800) Resp:  [15-28] 15 (08/03 1800) BP: (111-128)/(62-78) 121/76 (08/03 1800) SpO2:  [98 %-100 %] 100 % (08/03 1800) Weight:  [100.8 kg] 100.8 kg (08/03 1600)  GENERAL: Awake, alert in NAD HEENT: - Normocephalic  and atraumatic, dry mm LUNGS - Clear to auscultation bilaterally with no wheezes CV - S1S2 RRR, no m/r/g, equal pulses bilaterally. ABDOMEN - Soft, nontender, nondistended with normoactive BS Ext: warm, well perfused, intact peripheral pulses,no edema  NEURO:  Mental Status: AA&Ox3, drowsy.   Language: speech is clear.  Naming, repetition, fluency, and comprehension intact. Cranial Nerves: PERRL, EOMI initially had a left gaze preference, which had improved on repeat exam visual fields full, no facial asymmetry, facial sensation intact, hearing intact, tongue/uvula/soft palate midline, normal sternocleidomastoid and trapezius muscle strength. No evidence of tongue atrophy or fibrillations Motor: Right arm 3/5, right leg 3/5, left arm 5/5, left leg 5/5 Tone: is normal and bulk is normal Sensation- Intact to light touch bilaterally Coordination: FTN intact bilaterally, no ataxia in BLE. Gait- deferred  NIHSS 1a Level of Conscious.: 1 1b LOC Questions: 0 1c LOC Commands: 0 2 Best Gaze: 1 3 Visual: 0 4 Facial Palsy: 0 5a Motor Arm - left: 0 5b Motor Arm - Right: 2 6a Motor Leg - Left: 0 6b Motor Leg - Right: 3 7 Limb Ataxia: 0 8 Sensory: 0 9 Best Language: 1 10 Dysarthria: 0 11 Extinct. and Inatten.: 1 TOTAL: 9   Labs I have reviewed labs in epic and the results pertinent to this consultation are:  CBC    Component Value Date/Time   WBC 9.2 07/14/2022 1610   RBC 4.72 07/14/2022 1610   HGB 13.0 07/14/2022 1610   HCT 40.6 07/14/2022 1610   PLT 198 07/14/2022 1610   MCV 86.0 07/14/2022 1610   MCV 76.5 (A) 07/06/2017 1809   MCH 27.5 07/14/2022 1610   MCHC 32.0 07/14/2022 1610   RDW 12.8 07/14/2022 1610   LYMPHSABS 2.6 07/14/2022 1610   MONOABS 0.5 07/14/2022 1610   EOSABS 0.1 07/14/2022 1610   BASOSABS 0.0 07/14/2022 1610    CMP     Component Value Date/Time   NA 134 (L) 07/14/2022 1735   K 4.0 07/14/2022 1735   CL 102 07/14/2022 1735   CO2 25 07/14/2022 1735    GLUCOSE 103 (H) 07/14/2022 1735   BUN 9 07/14/2022 1735   CREATININE 0.67 07/14/2022 1735   CALCIUM 8.7 (L) 07/14/2022 1735   PROT 6.5 07/14/2022 1735   ALBUMIN 3.5 07/14/2022 1735   AST 20 07/14/2022 1735   ALT 21 07/14/2022 1735   ALKPHOS 76 07/14/2022 1735   BILITOT 0.4 07/14/2022 1735   GFRNONAA >60 07/14/2022 1735    Lipid Panel  No results found for: "CHOL", "TRIG", "HDL", "CHOLHDL", "VLDL", "LDLCALC", "LDLDIRECT"   Imaging I have reviewed the images obtained:  Code stroke CT-head 1. No evidence of acute intracranial abnormality. 2. ASPECTS is 10.  CTA H/N with Perf: 1. No emergent large vessel occlusion or proximal hemodynamically significant stenosis. 2. No evidence  of core infarct or penumbra by CT perfusion.   Assessment:  Linda Blackwell is a 56 y.o. female with HTN not on medications who presenting from a doctors office after a syncopal episode. She does not have any known allergies. 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 She reported that her hands weren't working right. She went to the a doctors office today and had a syncopal episode witnessed by the nursing staff. EMS was called, at 1557 she had an abrupt neurological change that prompted them to activate a code stroke 2/2 R sided weakness, R neglect, and L gaze preference. She was outside the window for TNK. CT head, CTA, and CTP all unremarkable.   Impression: TIA/stroke workup  Recommendations: - Admit for stroke workup - Permissive HTN x48 hrs from sx onset or until stroke ruled out by MRI goal BP <220/110. PRN labetalol or hydralazine if BP above these parameters. Avoid oral antihypertensives. - MRI brain wo contrast - TTE w/ bubble - Check A1c and LDL + add statin per guidelines - ASA 81mg  daily + plavix 75mg  daily x21 days f/b ASA 81mg  daily monotherapy after that - q4 hr neuro checks - STAT head CT for any change in neuro  exam - Tele - PT/OT/SLP - Stroke education - Amb referral to neurology upon discharge   Stroke team will continue to follow.  , MD [facility] [phone]  If 7pm- 7am, please page neurology on call as listed in AMION.

## 2022-07-14 NOTE — ED Provider Notes (Signed)
[facility] EMERGENCY DEPARTMENT Provider Note   CSN: [id number] Arrival date & time: 07/14/22  1605  An emergency department physician performed an initial assessment on this suspected stroke patient at 25.  History  Chief Complaint  Patient presents with   Code Stroke    Linda Blackwell is a 56 y.o. female.  Presenting to the emergency room due to concern for weakness on her right side.  She states that she woke up this morning and felt like her right side was a little bit numb.  This afternoon she felt like her right arm got severely weak suddenly.  She said that the sudden worsening of her neurologic symptoms happened after she had received some very stressful news.  Did not feel stressed at the time that it started.  Since getting to the hospital her symptoms have improved, she denies ongoing numbness or weakness at this time.  HPI     Home Medications Prior to Admission medications   Medication Sig Start Date End Date Taking? Authorizing Provider  hydroxypropyl methylcellulose / hypromellose (ISOPTO TEARS / GONIOVISC) 2.5 % ophthalmic solution Place 1 drop into both eyes as needed for dry eyes.   Yes [provider]  ibuprofen (ADVIL) 200 MG tablet Take 400 mg by mouth every 6 (six) hours as needed for headache or mild pain.   Yes [provider]      Allergies    Patient has no known allergies.    Review of Systems   Review of Systems  Neurological:  Positive for weakness and numbness.  All other systems reviewed and are negative.   Physical Exam Updated Vital Signs BP (!) 91/52   Pulse (!) 52   Temp 97.9 F (36.6 C) (Oral)   Resp 12   Ht 5\' 5"  (1.651 m)   Wt 100.8 kg   SpO2 94%   BMI 36.98 kg/m  Physical Exam Vitals and nursing note reviewed.  Constitutional:      General: She is not in acute distress.    Appearance: She is well-developed.  HENT:     Head: Normocephalic and atraumatic.  Eyes:     Conjunctiva/sclera:  Conjunctivae normal.  Cardiovascular:     Rate and Rhythm: Normal rate and regular rhythm.     Heart sounds: No murmur heard. Pulmonary:     Effort: Pulmonary effort is normal. No respiratory distress.     Breath sounds: Normal breath sounds.  Abdominal:     Palpations: Abdomen is soft.     Tenderness: There is no abdominal tenderness.  Musculoskeletal:        General: No swelling.     Cervical back: Neck supple.  Skin:    General: Skin is warm and dry.     Capillary Refill: Capillary refill takes less than 2 seconds.  Neurological:     Mental Status: She is alert.     Comments: AAOx3 CN 2-12 intact, speech clear visual fields intact 5/5 strength in b/l UE and LE Sensation to light touch intact in b/l UE and LE Normal FNF  Psychiatric:        Mood and Affect: Mood normal.     ED Results / Procedures / Treatments   Labs (all labs ordered are listed, but only abnormal results are displayed) Labs Reviewed  COMPREHENSIVE METABOLIC PANEL - Abnormal; Notable for the following components:      Result Value   Sodium 134 (*)    Glucose, Bld 103 (*)  Calcium 8.7 (*)    All other components within normal limits  I-STAT CHEM 8, ED - Abnormal; Notable for the following components:   Glucose, Bld 122 (*)    Calcium, Ion 1.12 (*)    All other components within normal limits  CBG MONITORING, ED - Abnormal; Notable for the following components:   Glucose-Capillary 151 (*)    All other components within normal limits  PROTIME-INR  APTT  CBC  DIFFERENTIAL  ETHANOL  RAPID URINE DRUG SCREEN, HOSP PERFORMED  LIPID PANEL  HEMOGLOBIN A1C  HIV ANTIBODY (ROUTINE TESTING W REFLEX)  URINALYSIS, COMPLETE (UACMP) WITH MICROSCOPIC    EKG EKG Interpretation  Date/Time:  Thursday July 14 2022 16:47:30 EDT Ventricular Rate:  68 PR Interval:  148 QRS Duration: 85 QT Interval:  389 QTC Calculation: 414 R Axis:   16 Text Interpretation: Sinus rhythm Confirmed by Marianna Fuss  [phone]) on 07/14/2022 10:18:02 PM  Radiology MR BRAIN WO CONTRAST  Result Date: 07/14/2022 CLINICAL DATA:  Stroke follow-up EXAM: MRI HEAD WITHOUT CONTRAST TECHNIQUE: Multiplanar, multiecho pulse sequences of the brain and surrounding structures were obtained without intravenous contrast. COMPARISON:  None Available. FINDINGS: Examination was discontinued prior to completion due to patient claustrophobia and inability to tolerate the full length of the exam. Only diffusion-weighted imaging and an axial FLAIR sequences were obtained. There is no acute ischemia. Parenchymal signal brain is normal. CSF spaces are normal. No midline shift or other mass effect. IMPRESSION: 1. Truncated examination due to patient claustrophobia and inability to tolerate the full length of the exam. 2. No acute ischemia. Electronically Signed   By: Deatra Robinson M.D.   On: 07/14/2022 20:19   CT ANGIO HEAD NECK W WO CM W PERF (CODE STROKE)  Result Date: 07/14/2022 CLINICAL DATA:  Neuro deficit, acute, stroke suspected R sided weakness and neglect, L gaze preference EXAM: CT ANGIOGRAPHY HEAD AND NECK CT PERFUSION BRAIN TECHNIQUE: Multidetector CT imaging of the head and neck was performed using the standard protocol during bolus administration of intravenous contrast. Multiplanar CT image reconstructions and MIPs were obtained to evaluate the vascular anatomy. Carotid stenosis measurements (when applicable) are obtained utilizing NASCET criteria, using the distal internal carotid diameter as the denominator. Multiphase CT imaging of the brain was performed following IV bolus contrast injection. Subsequent parametric perfusion maps were calculated using RAPID software. RADIATION DOSE REDUCTION: This exam was performed according to the departmental dose-optimization program which includes automated exposure control, adjustment of the mA and/or kV according to patient size and/or use of iterative reconstruction technique. CONTRAST:   OMNIPAQUE IOHEXOL 350 MG/ML SOLN COMPARISON:  Same day CT head. FINDINGS: CTA NECK FINDINGS Aortic arch: Great vessel origins are patent without significant stenosis. Right carotid system: No evidence of dissection, stenosis (50% or greater), or occlusion. Left carotid system: No evidence of dissection, stenosis (50% or greater), or occlusion. Vertebral arteries: Codominant. No evidence of dissection, stenosis (50% or greater), or occlusion. Skeleton: No acute findings. Other neck: No acute abnormality. Upper chest: Visualized lung apices are clear. Review of the MIP images confirms the above findings CTA HEAD FINDINGS Anterior circulation: Bilateral intracranial ICAs, MCAs, MCAs, and ACAs are patent without proximal hemodynamically significant stenosis. The A2 ACA may be partially azygos, anatomic variant. Posterior circulation: Bilateral intradural vertebral arteries, basilar artery and bilateral posterior cerebral arteries are patent without proximal hemodynamically significant stenosis. Venous sinuses: As permitted by contrast timing, patent. CT Brain Perfusion Findings: ASPECTS: 10. CBF (<30%) Volume: 66mL Perfusion (Tmax>6.0s) volume:  9mL Mismatch Volume: 26mL Infarction Location:None identified. IMPRESSION: 1. No emergent large vessel occlusion or proximal hemodynamically significant stenosis. 2. No evidence of core infarct or penumbra by CT perfusion. MRI could provide more sensitive evaluation for acute infarct if clinically indicated. Findings discussed with Dr. Quinn Axe via telephone at 4:45 p.m. Electronically Signed   By: Margaretha Sheffield M.D.   On: 07/14/2022 17:01   CT HEAD CODE STROKE WO CONTRAST  Result Date: 07/14/2022 CLINICAL DATA:  Code stroke.  Neuro deficit, acute, stroke suspected EXAM: CT HEAD WITHOUT CONTRAST TECHNIQUE: Contiguous axial images were obtained from the base of the skull through the vertex without intravenous contrast. RADIATION DOSE REDUCTION: This exam was performed according  to the departmental dose-optimization program which includes automated exposure control, adjustment of the mA and/or kV according to patient size and/or use of iterative reconstruction technique. COMPARISON:  None Available. FINDINGS: Brain: No evidence of acute infarction, hemorrhage, hydrocephalus, extra-axial collection or mass lesion/mass effect. Vascular: No hyperdense vessel identified. Skull: No acute fracture. Sinuses/Orbits: Clear sinuses. Other: No mastoid effusions. ASPECTS [facility] Stroke Program Early CT Score) total score (0-10 with 10 being normal): 10. IMPRESSION: 1. No evidence of acute intracranial abnormality. 2. ASPECTS is 10. Code stroke imaging results were communicated on 07/14/2022 at 4:25 pm to provider [facility] via secure text paging. Electronically Signed   By: Margaretha Sheffield M.D.   On: 07/14/2022 16:25    Procedures Procedures    Medications Ordered in ED Medications  LORazepam (ATIVAN) 2 MG/ML injection (has no administration in time range)  LORazepam (ATIVAN) injection 1 mg (has no administration in time range)   stroke: early stages of recovery book (has no administration in time range)  aspirin chewable tablet 81 mg (81 mg Oral Given 07/14/22 1754)  clopidogrel (PLAVIX) tablet 75 mg (75 mg Oral Given 07/14/22 1833)   stroke: early stages of recovery book (has no administration in time range)  acetaminophen (TYLENOL) tablet 650 mg (has no administration in time range)    Or  acetaminophen (TYLENOL) 160 MG/5ML solution 650 mg (has no administration in time range)    Or  acetaminophen (TYLENOL) suppository 650 mg (has no administration in time range)  sodium chloride flush (NS) 0.9 % injection 3 mL (3 mLs Intravenous Given 07/14/22 1655)  LORazepam (ATIVAN) injection 1 mg (1 mg Intravenous Given 07/14/22 1645)  iohexol (OMNIPAQUE) 350 MG/ML injection 175 mL (175 mLs Intravenous Contrast Given 07/14/22 1648)  sodium chloride 0.9 % bolus 500 mL (0 mLs Intravenous Stopped 07/14/22  1846)    ED Course/ Medical Decision Making/ A&P                           Medical Decision Making Amount and/or Complexity of Data Reviewed Labs: ordered. Radiology: ordered.  Risk Decision regarding hospitalization.   56 year old lady presenting to ER as code stroke.  Concern for right-sided weakness and numbness.  Initially patient was noted to have significant right-sided weakness however this rapidly improved.  Notably this was also associated with significant stress and anxiety.  Initial CT head, CTA and CT perfusion studies were all grossly negative.  Neurology recommended admission for TIA/stroke work-up.  Given symptoms have resolved, had some symptoms earlier this morning, outside window, no TNK/tPA candidate.  Discussed case in detail with Dr. Quinn Axe the neurologist.  Discussed with Dr. Marcello Moores who will [facility] to [facility].  Reviewed basic labs, slight hyponatremia but otherwise grossly stable.  Final Clinical Impression(s) / ED Diagnoses Final diagnoses:  TIA (transient ischemic attack)  Transient neurological symptoms    Rx / DC Orders ED Discharge Orders     None         Milagros Loll, MD 07/14/22 2242

## 2022-07-14 NOTE — ED Triage Notes (Addendum)
Pt BIB EMS due to code stroke. Pt went to PCP for syncopal events. Pt was having vision changes but resolved. Pt was hypertensive in 200's systolic. Pt was complaining of left sided weakness. Pt was LSN 1557. Pt axox4. Systolic 160 on arrival.

## 2022-07-14 NOTE — Progress Notes (Signed)
Evaluation after Contrast Extravasation  Patient seen and examined immediately after contrast extravasation while in CT. Approximally 60 mL of contrast was injected.   Exam: There is mild swelling at the lateral aspect of left elbow.  There is no erythema. There is no discoloration. There are no blisters. There are no signs of decreased perfusion of the skin.  It is not warm to touch.  The patient has intact ROM in fingers.  Left Radial pulse normal.  Per contrast extravasation protocol, patient to keep an ice pack on the area for 20-60 minutes at a time for about 48 hours. Keep left arm elevated as much as possible.  If pt developed any sign of decreased perfusion in the left arm, please consult plastic surgery urgently.     Willette Brace PA-C 07/14/2022 4:39 PM

## 2022-07-14 NOTE — Consult Note (Deleted)
Neurology Consultation  Reason for Consult: Code stroke  Referring Physician: Dr Stevie Kern  CC: right side weakness  History is obtained from:patient and husband  HPI: Letisia Schwalb is a 56 y.o. female with HTN not on medications who presenting from a doctors office after a syncopal episode. She does not have any known allergies. 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 She reported that her hands weren't working right. She went to the a doctors office today and had a syncopal episode witnessed by the nursing staff. EMS was called, at 1557 she had an abrupt neurological change that prompted them to activate a code stroke with right side weakness. Initially for EMS her BP was 200's on arrival to [facility] ED SBP 160's  Per her husband, she has been diagnosed with hypertension but has never taken medication for it. She does not take any medications at home. He also reports that their son was taken to jail and $4000 was taken out of her bank account which has caused a lot of stress for her. Her stress has been very high lately. She does also report that she has headache about 2 times per year. She was very anxious in CT scan while attempting to get PIV, 1 mg IV ativan was given and calmed the patient. She endorses that she has a lot of stressors in her life and has bad anxiety. She denies, smoking, alcohol or illicit drug use  CT head no acute process, ASPECTS 10. CTA head/neck with no LVO.   LKW: yesterday  IV thrombolysis given?: no, outside window Premorbid modified Rankin scale (mRS):  0-Completely asymptomatic and back to baseline post-stroke  ROS: Full ROS was performed and is negative except as noted in the HPI.    No past medical history on file.   No family history on file.   Social History:   reports that she has never smoked. She has never used smokeless tobacco. No history on file for alcohol use and drug  use.  Medications  Current Facility-Administered Medications:    LORazepam (ATIVAN) 2 MG/ML injection, , , ,    LORazepam (ATIVAN) injection 1 mg, 1 mg, Intravenous, Once PRN, Pamalee Leyden, [place], NP   sodium chloride 0.9 % bolus 500 mL, 500 mL, Intravenous, Once, Elmer Picker, NP  Current Outpatient Medications:    cephALEXin (KEFLEX) 500 MG capsule, Take 1 capsule (500 mg total) by mouth 2 (two) times daily., Disp: 14 capsule, Rfl: 0   cyclobenzaprine (FLEXERIL) 10 MG tablet, Take 0.5-1 tablets (5-10 mg total) by mouth 3 (three) times daily as needed., Disp: 30 tablet, Rfl: 0   hydroxypropyl methylcellulose / hypromellose (ISOPTO TEARS / GONIOVISC) 2.5 % ophthalmic solution, Place 1 drop into both eyes as needed for dry eyes., Disp: , Rfl:    ibuprofen (ADVIL,MOTRIN) 600 MG tablet, Take 1 tablet (600 mg total) by mouth every 8 (eight) hours as needed., Disp: 21 tablet, Rfl: 0   ibuprofen (ADVIL,MOTRIN) 800 MG tablet, Take 1 tablet (800 mg total) by mouth 3 (three) times daily., Disp: 21 tablet, Rfl: 0   methocarbamol (ROBAXIN) 500 MG tablet, Take 2 tablets (1,000 mg total) by mouth every 8 (eight) hours as needed for muscle spasms., Disp: 30 tablet, Rfl: 0   Exam: Current vital signs: BP 111/62   Pulse 64   Temp 97.8 F (36.6 C) (Oral)   Resp (!) 22   Wt 100.8 kg  SpO2 98%   BMI 36.98 kg/m  Vital signs in last 24 hours: Temp:  [97.8 F (36.6 C)] 97.8 F (36.6 C) (08/03 1652) Pulse Rate:  [61-67] 64 (08/03 1715) Resp:  [22-28] 22 (08/03 1715) BP: (111-126)/(62-78) 111/62 (08/03 1715) SpO2:  [98 %] 98 % (08/03 1715) Weight:  [100.8 kg] 100.8 kg (08/03 1600)  GENERAL: Awake, alert in NAD HEENT: - Normocephalic and atraumatic, dry mm LUNGS - Clear to auscultation bilaterally with no wheezes CV - S1S2 RRR, no m/r/g, equal pulses bilaterally. ABDOMEN - Soft, nontender, nondistended with normoactive BS Ext: warm, well perfused, intact peripheral pulses,no edema  NEURO:  Mental  Status: AA&Ox3, drowsy.   Language: speech is clear.  Naming, repetition, fluency, and comprehension intact. Cranial Nerves: PERRL, EOMI initially had a left gaze preference, which had improved on repeat exam visual fields full, no facial asymmetry, facial sensation intact, hearing intact, tongue/uvula/soft palate midline, normal sternocleidomastoid and trapezius muscle strength. No evidence of tongue atrophy or fibrillations Motor: Right arm 3/5, right leg 3/5, left arm 5/5, left leg 5/5 Tone: is normal and bulk is normal Sensation- Intact to light touch bilaterally Coordination: FTN intact bilaterally, no ataxia in BLE. Gait- deferred  NIHSS 1a Level of Conscious.: 1 1b LOC Questions: 0 1c LOC Commands: 0 2 Best Gaze: 1 3 Visual: 0 4 Facial Palsy: 0 5a Motor Arm - left: 0 5b Motor Arm - Right: 2 6a Motor Leg - Left: 0 6b Motor Leg - Right: 3 7 Limb Ataxia: 0 8 Sensory: 0 9 Best Language: 1 10 Dysarthria: 0 11 Extinct. and Inatten.: 1 TOTAL: 9   Labs I have reviewed labs in epic and the results pertinent to this consultation are:  CBC    Component Value Date/Time   WBC 9.2 07/14/2022 1610   RBC 4.72 07/14/2022 1610   HGB 13.0 07/14/2022 1610   HCT 40.6 07/14/2022 1610   PLT 198 07/14/2022 1610   MCV 86.0 07/14/2022 1610   MCV 76.5 (A) 07/06/2017 1809   MCH 27.5 07/14/2022 1610   MCHC 32.0 07/14/2022 1610   RDW 12.8 07/14/2022 1610   LYMPHSABS 2.6 07/14/2022 1610   MONOABS 0.5 07/14/2022 1610   EOSABS 0.1 07/14/2022 1610   BASOSABS 0.0 07/14/2022 1610    CMP     Component Value Date/Time   NA 139 01/07/2021 2128   K 3.8 01/07/2021 2128   CL 102 01/07/2021 2128   CO2 26 01/07/2021 2128   GLUCOSE 103 (H) 01/07/2021 2128   BUN 17 01/07/2021 2128   CREATININE 0.91 01/07/2021 2128   CALCIUM 9.1 01/07/2021 2128   GFRNONAA >60 01/07/2021 2128    Lipid Panel  No results found for: "CHOL", "TRIG", "HDL", "CHOLHDL", "VLDL", "LDLCALC", "LDLDIRECT"   Imaging I  have reviewed the images obtained:  Code stroke CT-head 1. No evidence of acute intracranial abnormality. 2. ASPECTS is 10.  CTA H/N with Perf: 1. No emergent large vessel occlusion or proximal hemodynamically significant stenosis. 2. No evidence of core infarct or penumbra by CT perfusion.   Assessment:  Yvonna Brun is a 56 y.o. female with HTN not on medications who presenting from a doctors office after a syncopal episode. She does not have any known allergies. 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 She reported that her hands weren't working right. She went to the a doctors office today and had a syncopal episode witnessed  by the nursing staff. EMS was called, at 1557 she had an abrupt neurological change that prompted them to activate a code stroke with right side weakness.   Impression: TIA/Stroke vs panic attack   Recommendations: - admit for stroke workup - HgbA1c, fasting lipid panel - MRI of the brain without contrast - Frequent neuro checks - Echocardiogram - Prophylactic therapy-Antiplatelet med: Aspirin - dose 81mg   - Risk factor modification - Telemetry monitoring - PT consult, OT consult, Speech consult - Check UDS  - Stroke team to follow   DNP, ACNPC-AG

## 2022-07-14 NOTE — ED Notes (Signed)
Pt reported to this RN that her earring set was missing after returning from CT scan. Patient then requested to file a police report in regards to her missing set of earrings. Patient's earrings then found by stroke RN Consuella Lose in stroke RN's pockets. Earrings returned to patient at this time.

## 2022-07-15 ENCOUNTER — Other Ambulatory Visit: Payer: Self-pay | Admitting: Cardiology

## 2022-07-15 ENCOUNTER — Encounter: Payer: Self-pay | Admitting: Internal Medicine

## 2022-07-15 ENCOUNTER — Inpatient Hospital Stay (HOSPITAL_COMMUNITY): Payer: BC Managed Care – PPO

## 2022-07-15 DIAGNOSIS — G459 Transient cerebral ischemic attack, unspecified: Secondary | ICD-10-CM | POA: Diagnosis not present

## 2022-07-15 LAB — ECHOCARDIOGRAM COMPLETE BUBBLE STUDY
Area-P 1/2: 2.92 cm2
S' Lateral: 2.6 cm

## 2022-07-15 LAB — LIPID PANEL
Cholesterol: 205 mg/dL — ABNORMAL HIGH (ref 0–200)
HDL: 46 mg/dL
LDL Cholesterol: 142 mg/dL — ABNORMAL HIGH (ref 0–99)
Total CHOL/HDL Ratio: 4.5 ratio
Triglycerides: 83 mg/dL
VLDL: 17 mg/dL (ref 0–40)

## 2022-07-15 LAB — HEMOGLOBIN A1C
Hgb A1c MFr Bld: 5.6 % (ref 4.8–5.6)
Mean Plasma Glucose: 114.02 mg/dL

## 2022-07-15 MED ORDER — ATORVASTATIN CALCIUM 40 MG PO TABS: 80.0000 mg | ORAL_TABLET | Freq: Every day | ORAL | Status: DC

## 2022-07-15 MED ORDER — CLOPIDOGREL BISULFATE 75 MG PO TABS: 75.0000 mg | ORAL_TABLET | Freq: Every day | ORAL | 0 refills | Status: AC

## 2022-07-15 MED ORDER — ATORVASTATIN CALCIUM 40 MG PO TABS: 40.0000 mg | ORAL_TABLET | Freq: Every day | ORAL | 11 refills | Status: AC

## 2022-07-15 MED ORDER — ASPIRIN 81 MG PO CHEW: 81.0000 mg | CHEWABLE_TABLET | Freq: Every day | ORAL | 2 refills | Status: AC

## 2022-07-15 NOTE — ED Notes (Signed)
Pt transported to ECHO. 

## 2022-07-15 NOTE — ED Notes (Signed)
Report given to Fentress H RN at this time

## 2022-07-15 NOTE — Discharge Summary (Signed)
Physician Discharge Summary  Linda Blackwell [id number] DOB: 04/25/1966 DOA: 07/14/2022  PCP: Patient, No Pcp Per  Admit date: 07/14/2022 Discharge date: 07/15/2022  Admitted From: Home Disposition: Home  Recommendations for Outpatient Follow-up:  Follow up with PCP in 1-2 weeks, she does follow-up at [facility]. Keep a logbook for blood pressure at home and bring it to doctors office for visit.  Home Health: N/A Equipment/Devices: N/A  Discharge Condition: Stable CODE STATUS: Full code Diet recommendation: Low-salt diet  Discharge summary: 56 year old with no medical history went to doctors office for checkup and she was just not feeling well and had numbness on the right hand.  She told them her right hand was not working.  She had an episode of syncope at the doctor's office, EMS was called and thought to have right-sided weakness so sent to emergency room.  Recently suffering from stressors.  Seen as code stroke in the ER, no neurological deficit hence no intervention candidate.  Kept on the monitoring overnight.  TIA: Complete resolution of symptoms now. CT head essentially normal.  Subsequent MRI brain was incomplete with motion artifact but no obvious evidence of infarction. CT angiogram head and neck, normal. TTE with bubble study, done and results are pending. Currently no deficits. Neurology recommended, Aspirin and Plavix for 21 days followed by aspirin 81 mg daily as monotherapy after that. LDL 142, starting on atorvastatin 40 mg daily. Hemoglobin A1c 5.6, currently no indication for treatment. Blood pressures are normal, low normal without intervention.  No indication for treatment.  Advised to keep up follow-up with primary care doctor.   Discharge Diagnoses:  Principal Problem:   TIA (transient ischemic attack)    Discharge Instructions  Discharge Instructions     Diet - low sodium heart healthy   Complete by: As directed    Discharge instructions    Complete by: As directed    Monitor your blood pressure at home and bring it to doctors office for follow up.   Increase activity slowly   Complete by: As directed       Allergies as of 07/15/2022   No Known Allergies      Medication List     TAKE these medications    aspirin 81 MG chewable tablet Chew 1 tablet (81 mg total) by mouth daily.   atorvastatin 40 MG tablet Commonly known as: Lipitor Take 1 tablet (40 mg total) by mouth daily.   clopidogrel 75 MG tablet Commonly known as: PLAVIX Take 1 tablet (75 mg total) by mouth daily for 21 days.   hydroxypropyl methylcellulose / hypromellose 2.5 % ophthalmic solution Commonly known as: ISOPTO TEARS / GONIOVISC Place 1 drop into both eyes as needed for dry eyes.   ibuprofen 200 MG tablet Commonly known as: ADVIL Take 400 mg by mouth every 6 (six) hours as needed for headache or mild pain.        [place] Follow up in 1 week(s).   Contact information: [place] [address] [phone]                No Known Allergies  Consultations: Neurology   Procedures/Studies: MR BRAIN WO CONTRAST  Result Date: 07/14/2022 CLINICAL DATA:  Stroke follow-up EXAM: MRI HEAD WITHOUT CONTRAST TECHNIQUE: Multiplanar, multiecho pulse sequences of the brain and surrounding structures were obtained without intravenous contrast. COMPARISON:  None Available. FINDINGS: Examination was discontinued prior to completion due to patient claustrophobia and  inability to tolerate the full length of the exam. Only diffusion-weighted imaging and an axial FLAIR sequences were obtained. There is no acute ischemia. Parenchymal signal brain is normal. CSF spaces are normal. No midline shift or other mass effect. IMPRESSION: 1. Truncated examination due to patient claustrophobia and inability to tolerate the full length of the exam. 2. No acute ischemia. Electronically Signed   By: Deatra Robinson M.D.   On: 07/14/2022 20:19   CT ANGIO HEAD NECK W WO CM W PERF (CODE STROKE)  Result Date: 07/14/2022 CLINICAL DATA:  Neuro deficit, acute, stroke suspected R sided weakness and neglect, L gaze preference EXAM: CT ANGIOGRAPHY HEAD AND NECK CT PERFUSION BRAIN TECHNIQUE: Multidetector CT imaging of the head and neck was performed using the standard protocol during bolus administration of intravenous contrast. Multiplanar CT image reconstructions and MIPs were obtained to evaluate the vascular anatomy. Carotid stenosis measurements (when applicable) are obtained utilizing NASCET criteria, using the distal internal carotid diameter as the denominator. Multiphase CT imaging of the brain was performed following IV bolus contrast injection. Subsequent parametric perfusion maps were calculated using RAPID software. RADIATION DOSE REDUCTION: This exam was performed according to the departmental dose-optimization program which includes automated exposure control, adjustment of the mA and/or kV according to patient size and/or use of iterative reconstruction technique. CONTRAST:  OMNIPAQUE IOHEXOL 350 MG/ML SOLN COMPARISON:  Same day CT head. FINDINGS: CTA NECK FINDINGS Aortic arch: Great vessel origins are patent without significant stenosis. Right carotid system: No evidence of dissection, stenosis (50% or greater), or occlusion. Left carotid system: No evidence of dissection, stenosis (50% or greater), or occlusion. Vertebral arteries: Codominant. No evidence of dissection, stenosis (50% or greater), or occlusion. Skeleton: No acute findings. Other neck: No acute abnormality. Upper chest: Visualized lung apices are clear. Review of the MIP images confirms the above findings CTA HEAD FINDINGS Anterior circulation: Bilateral intracranial ICAs, MCAs, MCAs, and ACAs are patent without proximal hemodynamically significant stenosis. The A2 ACA may be partially azygos, anatomic variant. Posterior circulation:  Bilateral intradural vertebral arteries, basilar artery and bilateral posterior cerebral arteries are patent without proximal hemodynamically significant stenosis. Venous sinuses: As permitted by contrast timing, patent. CT Brain Perfusion Findings: ASPECTS: 10. CBF (<30%) Volume: 71mL Perfusion (Tmax>6.0s) volume: 34mL Mismatch Volume: 28mL Infarction Location:None identified. IMPRESSION: 1. No emergent large vessel occlusion or proximal hemodynamically significant stenosis. 2. No evidence of core infarct or penumbra by CT perfusion. MRI could provide more sensitive evaluation for acute infarct if clinically indicated. Findings discussed with Dr. Selina Cooley via telephone at 4:45 p.m. Electronically Signed   By: Feliberto Harts M.D.   On: 07/14/2022 17:01   CT HEAD CODE STROKE WO CONTRAST  Result Date: 07/14/2022 CLINICAL DATA:  Code stroke.  Neuro deficit, acute, stroke suspected EXAM: CT HEAD WITHOUT CONTRAST TECHNIQUE: Contiguous axial images were obtained from the base of the skull through the vertex without intravenous contrast. RADIATION DOSE REDUCTION: This exam was performed according to the departmental dose-optimization program which includes automated exposure control, adjustment of the mA and/or kV according to patient size and/or use of iterative reconstruction technique. COMPARISON:  None Available. FINDINGS: Brain: No evidence of acute infarction, hemorrhage, hydrocephalus, extra-axial collection or mass lesion/mass effect. Vascular: No hyperdense vessel identified. Skull: No acute fracture. Sinuses/Orbits: Clear sinuses. Other: No mastoid effusions. ASPECTS [facility] Stroke Program Early CT Score) total score (0-10 with 10 being normal): 10. IMPRESSION: 1. No evidence of acute intracranial abnormality. 2. ASPECTS is 10. Code stroke  imaging results were communicated on 07/14/2022 at 4:25 pm to provider [facility] via secure text paging. Electronically Signed   By: Margaretha Sheffield M.D.   On: 07/14/2022 16:25    (Echo, Carotid, EGD, Colonoscopy, ERCP)    Subjective: Patient seen and examined.  I was called early morning by the nursing staff that patient wants to talk to me and leave as soon as possible.  She has been in the ER all night, walking around and anxious to go home.  Currently denies any complaints.  Blood pressures has been stable without issues.  Agreed to start on medications.  She agreed to stay until echocardiogram is done before leaving.  Husband arrived at the bedside.   Discharge Exam: Vitals:   07/15/22 0645 07/15/22 0825  BP: 135/88 117/66  Pulse: 71 68  Resp: 19 20  Temp:  98.8 F (37.1 C)  SpO2: 100% 100%   Vitals:   07/15/22 0615 07/15/22 0630 07/15/22 0645 07/15/22 0825  BP: (!) 99/59 124/66 135/88 117/66  Pulse: (!) 48 (!) 53 71 68  Resp: 20 19 19 20   Temp:    98.8 F (37.1 C)  TempSrc:    Oral  SpO2: 97% 94% 100% 100%  Weight:      Height:        General: Pt is alert, awake, not in acute distress Cardiovascular: RRR, S1/S2 +, no rubs, no gallops Respiratory: CTA bilaterally, no wheezing, no rhonchi Abdominal: Soft, NT, ND, bowel sounds + Extremities: no edema, no cyanosis, no neurological deficits.    The results of significant diagnostics from this hospitalization (including imaging, microbiology, ancillary and laboratory) are listed below for reference.     Microbiology: No results found for this or any previous visit (from the past 240 hour(s)).   Labs: BNP (last 3 results) No results for input(s): "BNP" in the last 8760 hours. Basic Metabolic Panel: Recent Labs  Lab 07/14/22 1620 07/14/22 1735  NA 139 134*  K 3.7 4.0  CL 104 102  CO2  --  25  GLUCOSE 122* 103*  BUN 11 9  CREATININE 0.60 0.67  CALCIUM  --  8.7*   Liver Function Tests: Recent Labs  Lab 07/14/22 1735  AST 20  ALT 21  ALKPHOS 76  BILITOT 0.4  PROT 6.5  ALBUMIN 3.5   No results for input(s): "LIPASE", "AMYLASE" in the last 168 hours. No results for input(s):  "AMMONIA" in the last 168 hours. CBC: Recent Labs  Lab 07/14/22 1610 07/14/22 1620  WBC 9.2  --   NEUTROABS 6.0  --   HGB 13.0 13.9  HCT 40.6 41.0  MCV 86.0  --   PLT 198  --    Cardiac Enzymes: No results for input(s): "CKTOTAL", "CKMB", "CKMBINDEX", "TROPONINI" in the last 168 hours. BNP: Invalid input(s): "POCBNP" CBG: Recent Labs  Lab 07/14/22 1611  GLUCAP 151*   D-Dimer No results for input(s): "DDIMER" in the last 72 hours. Hgb A1c Recent Labs    07/15/22 0310  HGBA1C 5.6   Lipid Profile Recent Labs    07/15/22 0310  CHOL 205*  HDL 46  LDLCALC 142*  TRIG 83  CHOLHDL 4.5   Thyroid function studies No results for input(s): "TSH", "T4TOTAL", "T3FREE", "THYROIDAB" in the last 72 hours.  Invalid input(s): "FREET3" Anemia work up No results for input(s): "VITAMINB12", "FOLATE", "FERRITIN", "TIBC", "IRON", "RETICCTPCT" in the last 72 hours. Urinalysis    Component Value Date/Time   COLORURINE YELLOW 01/07/2021 0035  APPEARANCEUR HAZY (A) 01/07/2021 0035   LABSPEC 1.034 (H) 01/07/2021 0035   PHURINE 5.0 01/07/2021 0035   GLUCOSEU NEGATIVE 01/07/2021 0035   HGBUR NEGATIVE 01/07/2021 0035   BILIRUBINUR NEGATIVE 01/07/2021 0035   BILIRUBINUR negative 07/06/2017 1813   KETONESUR 5 (A) 01/07/2021 0035   PROTEINUR 30 (A) 01/07/2021 0035   UROBILINOGEN 0.2 07/06/2017 1813   NITRITE NEGATIVE 01/07/2021 0035   LEUKOCYTESUR MODERATE (A) 01/07/2021 0035   Sepsis Labs Recent Labs  Lab 07/14/22 1610  WBC 9.2   Microbiology No results found for this or any previous visit (from the past 240 hour(s)).   Time coordinating discharge: 32 minutes  SIGNED:   Dorcas Carrow, MD  [facility] 07/15/2022, 1:00 PM

## 2022-07-15 NOTE — Progress Notes (Signed)
[facility] was asked to arrange a 30 day monitor on this patient for evaluation of TIA. Order has been placed, Dr. Jacques Navy will be the reader as she is doc of the day.   Jonita Albee, PA-C 07/15/2022 2:07 PM

## 2022-07-15 NOTE — ED Notes (Signed)
Per admitting, pt can leave after ECHO

## 2022-07-15 NOTE — Progress Notes (Signed)
PT Cancellation Note  Patient Details Name: Linda Blackwell MRN: [id number] DOB: 14-Feb-1966   Cancelled Treatment:    Reason Eval/Treat Not Completed: Patient at procedure or test/unavailable (echo). Per RN plan is for pt to d/c home after echo. PT will continue to f/u with pt acutely while admitted.    [name] 07/15/2022, 12:07 PM

## 2022-07-15 NOTE — ED Notes (Signed)
Pt continually standing in hallway, ECHO called for update.

## 2022-07-15 NOTE — Progress Notes (Addendum)
STROKE TEAM PROGRESS NOTE   INTERVAL HISTORY Patient is seen in her room with her husband at the bedside.  Yesterday, she had a syncopal event after feeling flushed while at her PCP's office along with some transient right sided weakness.  She had some right sided paresthesias yesterday as well.  MRI was negative for acute stroke.  CT angiogram of the brain and neck did not show any large vessel stenosis or occlusion.  Patient states she has had similar episodes of syncope many times in the past related to her painful menstruation.  Echocardiogram showed normal ejection fraction.  EEG is pending.  Patient is keen to go home as today is her birthday and the family has planned a party for her  Vitals:   07/15/22 0615 07/15/22 0630 07/15/22 0645 07/15/22 0825  BP: (!) 99/59 124/66 135/88 117/66  Pulse: (!) 48 (!) 53 71 68  Resp: 20 19 19 20   Temp:    98.8 F (37.1 C)  TempSrc:    Oral  SpO2: 97% 94% 100% 100%  Weight:      Height:       CBC:  Recent Labs  Lab 07/14/22 1610 07/14/22 1620  WBC 9.2  --   NEUTROABS 6.0  --   HGB 13.0 13.9  HCT 40.6 41.0  MCV 86.0  --   PLT 198  --    Basic Metabolic Panel:  Recent Labs  Lab 07/14/22 1620 07/14/22 1735  NA 139 134*  K 3.7 4.0  CL 104 102  CO2  --  25  GLUCOSE 122* 103*  BUN 11 9  CREATININE 0.60 0.67  CALCIUM  --  8.7*   Lipid Panel:  Recent Labs  Lab 07/15/22 0310  CHOL 205*  TRIG 83  HDL 46  CHOLHDL 4.5  VLDL 17  LDLCALC 09/14/22*   HgbA1c:  Recent Labs  Lab 07/15/22 0310  HGBA1C 5.6   Urine Drug Screen: No results for input(s): "LABOPIA", "COCAINSCRNUR", "LABBENZ", "AMPHETMU", "THCU", "LABBARB" in the last 168 hours.  Alcohol Level  Recent Labs  Lab 07/14/22 1610  ETH <10    IMAGING past 24 hours MR BRAIN WO CONTRAST  Result Date: 07/14/2022 CLINICAL DATA:  Stroke follow-up EXAM: MRI HEAD WITHOUT CONTRAST TECHNIQUE: Multiplanar, multiecho pulse sequences of the brain and surrounding structures were obtained  without intravenous contrast. COMPARISON:  None Available. FINDINGS: Examination was discontinued prior to completion due to patient claustrophobia and inability to tolerate the full length of the exam. Only diffusion-weighted imaging and an axial FLAIR sequences were obtained. There is no acute ischemia. Parenchymal signal brain is normal. CSF spaces are normal. No midline shift or other mass effect. IMPRESSION: 1. Truncated examination due to patient claustrophobia and inability to tolerate the full length of the exam. 2. No acute ischemia. Electronically Signed   By: 09/13/2022 M.D.   On: 07/14/2022 20:19   CT ANGIO HEAD NECK W WO CM W PERF (CODE STROKE)  Result Date: 07/14/2022 CLINICAL DATA:  Neuro deficit, acute, stroke suspected R sided weakness and neglect, L gaze preference EXAM: CT ANGIOGRAPHY HEAD AND NECK CT PERFUSION BRAIN TECHNIQUE: Multidetector CT imaging of the head and neck was performed using the standard protocol during bolus administration of intravenous contrast. Multiplanar CT image reconstructions and MIPs were obtained to evaluate the vascular anatomy. Carotid stenosis measurements (when applicable) are obtained utilizing NASCET criteria, using the distal internal carotid diameter as the denominator. Multiphase CT imaging of the brain was performed following  IV bolus contrast injection. Subsequent parametric perfusion maps were calculated using RAPID software. RADIATION DOSE REDUCTION: This exam was performed according to the departmental dose-optimization program which includes automated exposure control, adjustment of the mA and/or kV according to patient size and/or use of iterative reconstruction technique. CONTRAST:  OMNIPAQUE IOHEXOL 350 MG/ML SOLN COMPARISON:  Same day CT head. FINDINGS: CTA NECK FINDINGS Aortic arch: Great vessel origins are patent without significant stenosis. Right carotid system: No evidence of dissection, stenosis (50% or greater), or occlusion. Left  carotid system: No evidence of dissection, stenosis (50% or greater), or occlusion. Vertebral arteries: Codominant. No evidence of dissection, stenosis (50% or greater), or occlusion. Skeleton: No acute findings. Other neck: No acute abnormality. Upper chest: Visualized lung apices are clear. Review of the MIP images confirms the above findings CTA HEAD FINDINGS Anterior circulation: Bilateral intracranial ICAs, MCAs, MCAs, and ACAs are patent without proximal hemodynamically significant stenosis. The A2 ACA may be partially azygos, anatomic variant. Posterior circulation: Bilateral intradural vertebral arteries, basilar artery and bilateral posterior cerebral arteries are patent without proximal hemodynamically significant stenosis. Venous sinuses: As permitted by contrast timing, patent. CT Brain Perfusion Findings: ASPECTS: 10. CBF (<30%) Volume: 20mL Perfusion (Tmax>6.0s) volume: 65mL Mismatch Volume: 2mL Infarction Location:None identified. IMPRESSION: 1. No emergent large vessel occlusion or proximal hemodynamically significant stenosis. 2. No evidence of core infarct or penumbra by CT perfusion. MRI could provide more sensitive evaluation for acute infarct if clinically indicated. Findings discussed with Dr. Selina Cooley via telephone at 4:45 p.m. Electronically Signed   By: Feliberto Harts M.D.   On: 07/14/2022 17:01   CT HEAD CODE STROKE WO CONTRAST  Result Date: 07/14/2022 CLINICAL DATA:  Code stroke.  Neuro deficit, acute, stroke suspected EXAM: CT HEAD WITHOUT CONTRAST TECHNIQUE: Contiguous axial images were obtained from the base of the skull through the vertex without intravenous contrast. RADIATION DOSE REDUCTION: This exam was performed according to the departmental dose-optimization program which includes automated exposure control, adjustment of the mA and/or kV according to patient size and/or use of iterative reconstruction technique. COMPARISON:  None Available. FINDINGS: Brain: No evidence of acute  infarction, hemorrhage, hydrocephalus, extra-axial collection or mass lesion/mass effect. Vascular: No hyperdense vessel identified. Skull: No acute fracture. Sinuses/Orbits: Clear sinuses. Other: No mastoid effusions. ASPECTS [facility] Stroke Program Early CT Score) total score (0-10 with 10 being normal): 10. IMPRESSION: 1. No evidence of acute intracranial abnormality. 2. ASPECTS is 10. Code stroke imaging results were communicated on 07/14/2022 at 4:25 pm to provider [facility] via secure text paging. Electronically Signed   By: Feliberto Harts M.D.   On: 07/14/2022 16:25    PHYSICAL EXAM General:  Alert, obese pleasant middle-aged lady in no acute distress Respiratory:  Regular, unlabored respirations on room air  NEURO:  Mental Status: AA&Ox3  Speech/Language: speech is without dysarthria or aphasia.  Fluency, and comprehension intact.  Cranial Nerves:  II: PERRL. Visual fields full.  III, IV, VI: EOMI. Eyelids elevate symmetrically.  V: Sensation is intact to light touch and symmetrical to face.  VII: Smile is symmetrical.  VIII: hearing intact to voice. IX, X: Phonation is normal.  XII: tongue is midline without fasciculations. Motor: 5/5 strength to all muscle groups tested.  Tone: is normal and bulk is normal Sensation- Intact to light touch bilaterally. Coordination: FTN intact bilaterally.No drift.  Gait- deferred   ASSESSMENT/PLAN Ms. Daje Stark is a 56 y.o. female with history of HTN presenting with a syncopal event after feeling flushed while at  her PCP's office along with some transient right sided weakness.  She had some right sided paresthesias yesterday as well.  She was noted to be quite hypertensive with SBP in the 200s for EMS, going down to the 160s on arrival to the ED.   MRI was negative for acute stroke. Will have 30 day cardiac monitor sent to patient to rule out atrial fibrillation.  TIA:  left sided TIA in setting of syncopal event followed by hypertensive  urgency. Code Stroke CT head No acute abnormality. ASPECTS 10.    CTA head & neck no LVO or hemodynamically significant stenosis CT perfusion no core or penumbra MRI  no acute ischemia, exam truncated due to claustrophobia 2D Echo ejection fraction 60 to 65%. LDL 142 HgbA1c 5.6 VTE prophylaxis - SCDs    Diet   Diet regular Room service appropriate? Yes; Fluid consistency: Thin   No antithrombotic prior to admission, now on aspirin 81 mg daily and clopidogrel 75 mg daily for three weeks then ASA indefinitely Therapy recommendations:  no OT follow up Disposition:  home  Hypertension Home meds:  none Stable Keep SBP <180 Long-term BP goal normotensive  Hyperlipidemia Home meds:  none LDL 142, goal < 70 Add atorvastatin 80 mg daily  Continue statin at discharge   Other Stroke Risk Factors Obesity, Body mass index is 36.98 kg/m., BMI >/= 30 associated with increased stroke risk, recommend weight loss, diet and exercise as appropriate   Other Active Problems none  Hospital day # 1  Cortney E Ernestina Columbia , MSN, AGACNP-BC [facility] See Amion for schedule and pager information 07/15/2022 1:24 PM  STROKE MD NOTE :  I have personally obtained history,examined this patient, reviewed notes, independently viewed imaging studies, participated in medical decision making and plan of care.ROS completed by me personally and pertinent positives fully documented  I have made any additions or clarifications directly to the above note. Agree with note above.  Patient presented with a syncopal event followed by a focal numbness suggestive of the TIA likely from small vessel disease since neurovascular imaging is negative.  Recommend check EEG and prolonged cardiac monitoring after discharge for his arrhythmia.  Aspirin and Plavix for 3 weeks followed by aspirin alone indefinitely.  Aggressive risk factor modification.  Long discussion with patient and husband at the bedside and  answered questions.  Greater than 50% time during this 50-minute visit was spent in counseling and coordination of care about her syncopal event and TIA and discussion about TIA and stroke prevention and treatment answering questions.  Delia Heady, MD Medical Director [facility] Stroke Center Pager: [phone] 07/15/2022 5:26 PM   To contact Stroke Continuity provider, please refer to [email or web address]. After hours, contact General Neurology

## 2022-07-15 NOTE — Progress Notes (Signed)
SLP Cancellation Note  Patient Details Name: Lauralei Clouse MRN: [id number] DOB: 01-Nov-1966   Cancelled treatment:       Reason Eval/Treat Not Completed: Other (comment) (nursing deferred; no concerns; pt leaving AMA per report)   Tressie Stalker, M.S., CCC-SLP 07/15/2022, 12:37 PM

## 2022-07-15 NOTE — Progress Notes (Signed)
OT Cancellation Note  Patient Details Name: Linda Blackwell MRN: [id number] DOB: 1966-02-02   Cancelled Treatment:    Reason Eval/Treat Not Completed: OT screened, no needs identified, will sign off. Per RN pt is back to her baseline and leaving AMA. Reported there is no OT needs at this time. OT will sign off.   Carolyna Yerian Elane Jenin Birdsall 07/15/2022, 1:11 PM

## 2022-07-15 NOTE — ED Notes (Signed)
Pt offered food and drink, given sprite.

## 2023-11-23 ENCOUNTER — Encounter (HOSPITAL_COMMUNITY): Payer: Self-pay

## 2023-11-23 ENCOUNTER — Emergency Department (HOSPITAL_COMMUNITY)
Admission: EM | Admit: 2023-11-23 | Discharge: 2023-11-23 | Disposition: A | Discharge status: Home / Self Care | Payer: BC Managed Care – PPO | Attending: Emergency Medicine | Admitting: Emergency Medicine

## 2023-11-23 DIAGNOSIS — Y92410 Unspecified street and highway as the place of occurrence of the external cause: Secondary | ICD-10-CM | POA: Insufficient documentation

## 2023-11-23 DIAGNOSIS — M542 Cervicalgia: Secondary | ICD-10-CM | POA: Diagnosis present
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
## 2023-11-23 NOTE — Discharge Instructions (Signed)
It was a pleasure taking care of you today.  Please alternate Tylenol ibuprofen if you have muscle soreness that occurs.  Please follow-up with primary care.  Return to emergency room with new or worsening symptoms.
